# Patient Record
Sex: Female | Born: 1935 | Race: White | Hispanic: No | Smoking: Never smoker
Health system: Southern US, Academic
[De-identification: ages and names within clinical notes are randomized; demographics above are authoritative.]

## PROBLEM LIST (undated history)

## (undated) DIAGNOSIS — I639 Cerebral infarction, unspecified: Secondary | ICD-10-CM

## (undated) DIAGNOSIS — B338 Other specified viral diseases: Secondary | ICD-10-CM

## (undated) DIAGNOSIS — I10 Essential (primary) hypertension: Secondary | ICD-10-CM

## (undated) DIAGNOSIS — H3530 Unspecified macular degeneration: Secondary | ICD-10-CM

## (undated) DIAGNOSIS — K5730 Diverticulosis of large intestine without perforation or abscess without bleeding: Secondary | ICD-10-CM

## (undated) HISTORY — PX: HX HYSTERECTOMY: SHX81

## (undated) HISTORY — PX: REVISION OF TOTAL HIP FEMORAL: SUR1276

## (undated) HISTORY — PX: HX CATARACT REMOVAL: SHX102

---

## 2002-02-08 ENCOUNTER — Other Ambulatory Visit (HOSPITAL_COMMUNITY): Payer: Self-pay

## 2007-10-19 IMAGING — MG MAMMO SCREEN W CAD
1 series · 4 of 4 positions shown · non-contrast
Comparison: 06/26/2006.

Amazigh, Quirijn

BILATERAL DIGITAL SCREENING MAMMOGRAMS COMPUTED-AIDED DIAGNOSIS:
HISTORY: Routine screening.

[Series 2: R CC · right · 4 of 4 slices shown]
[im 1/4]
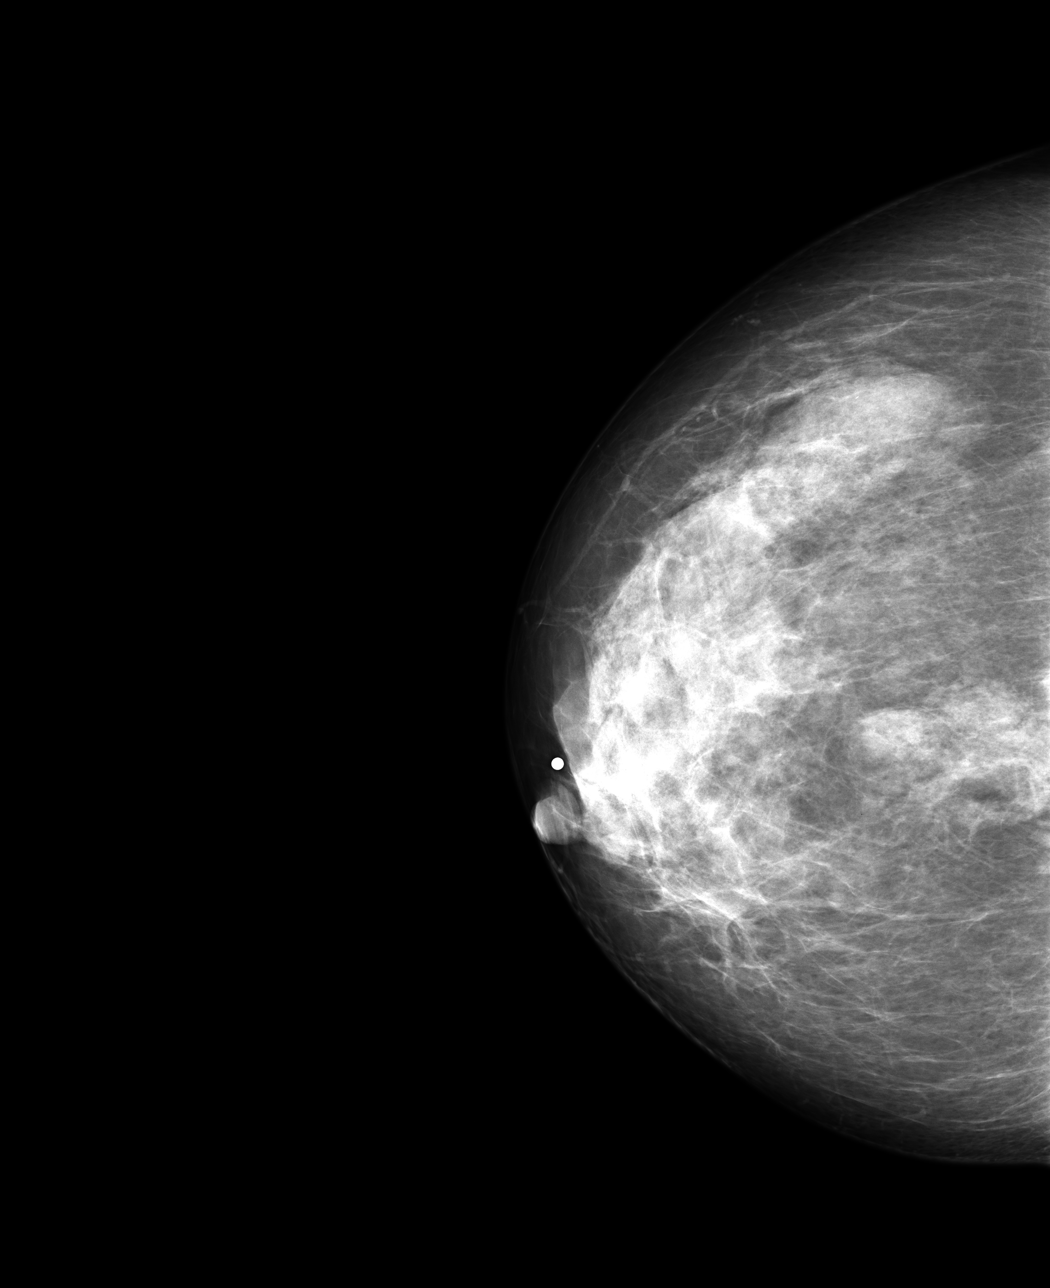
[im 2/4]
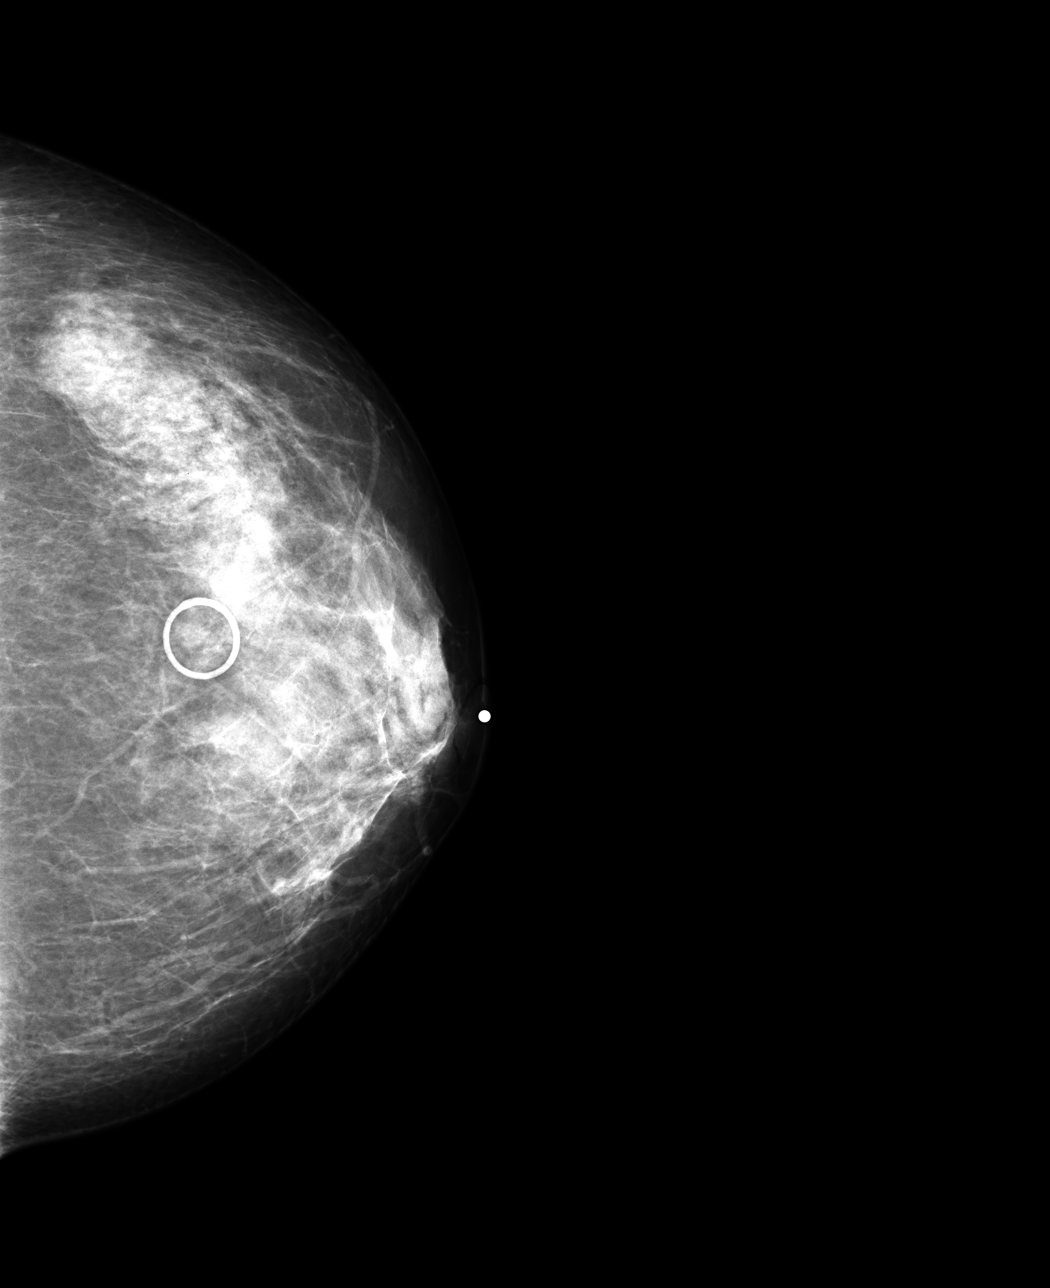
[im 3/4]
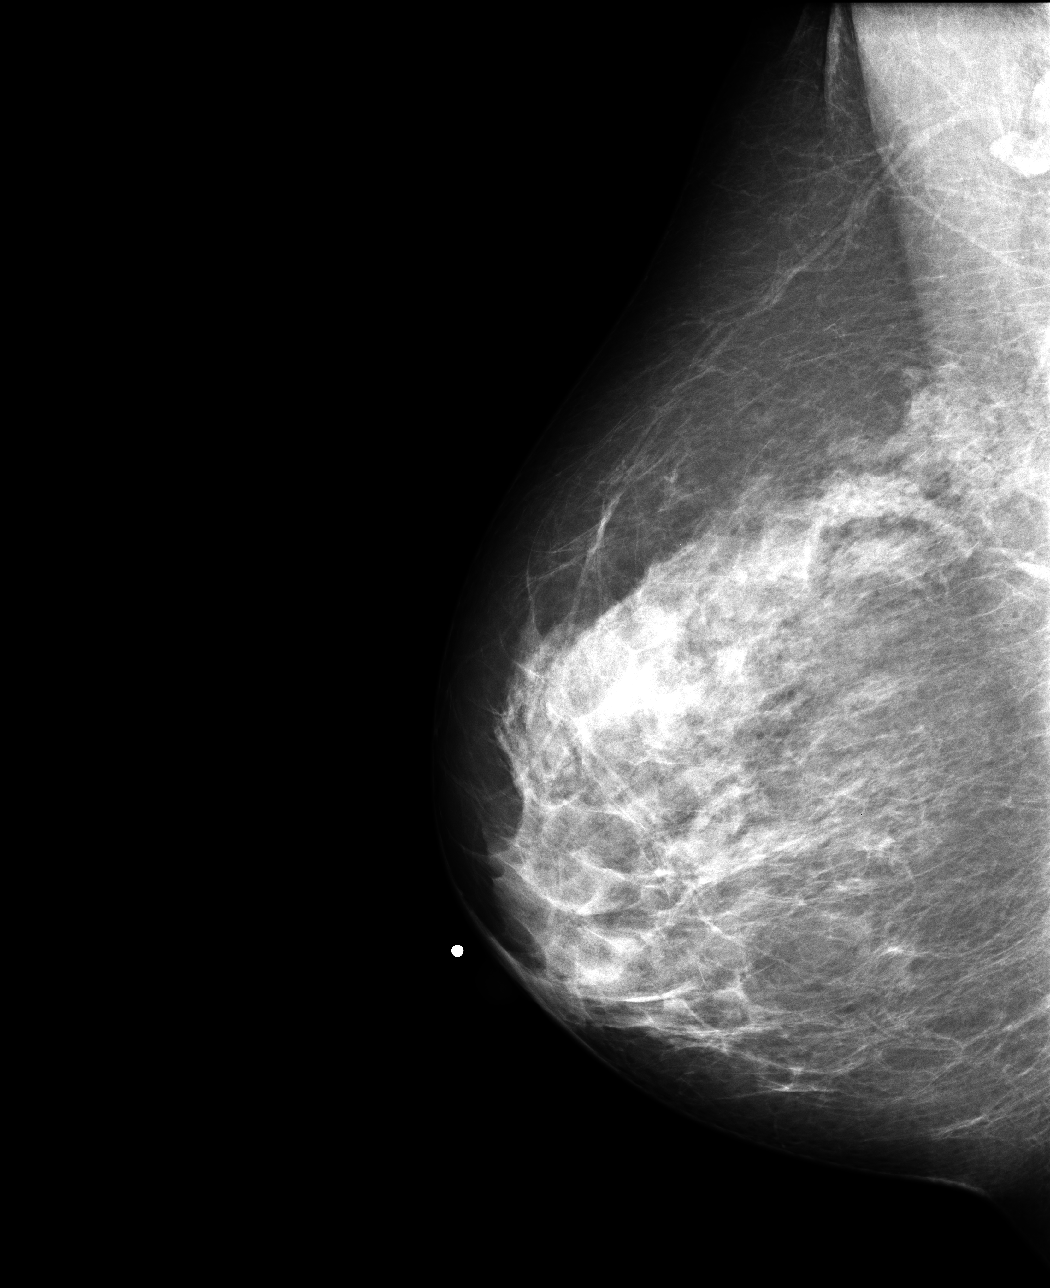
[im 4/4]
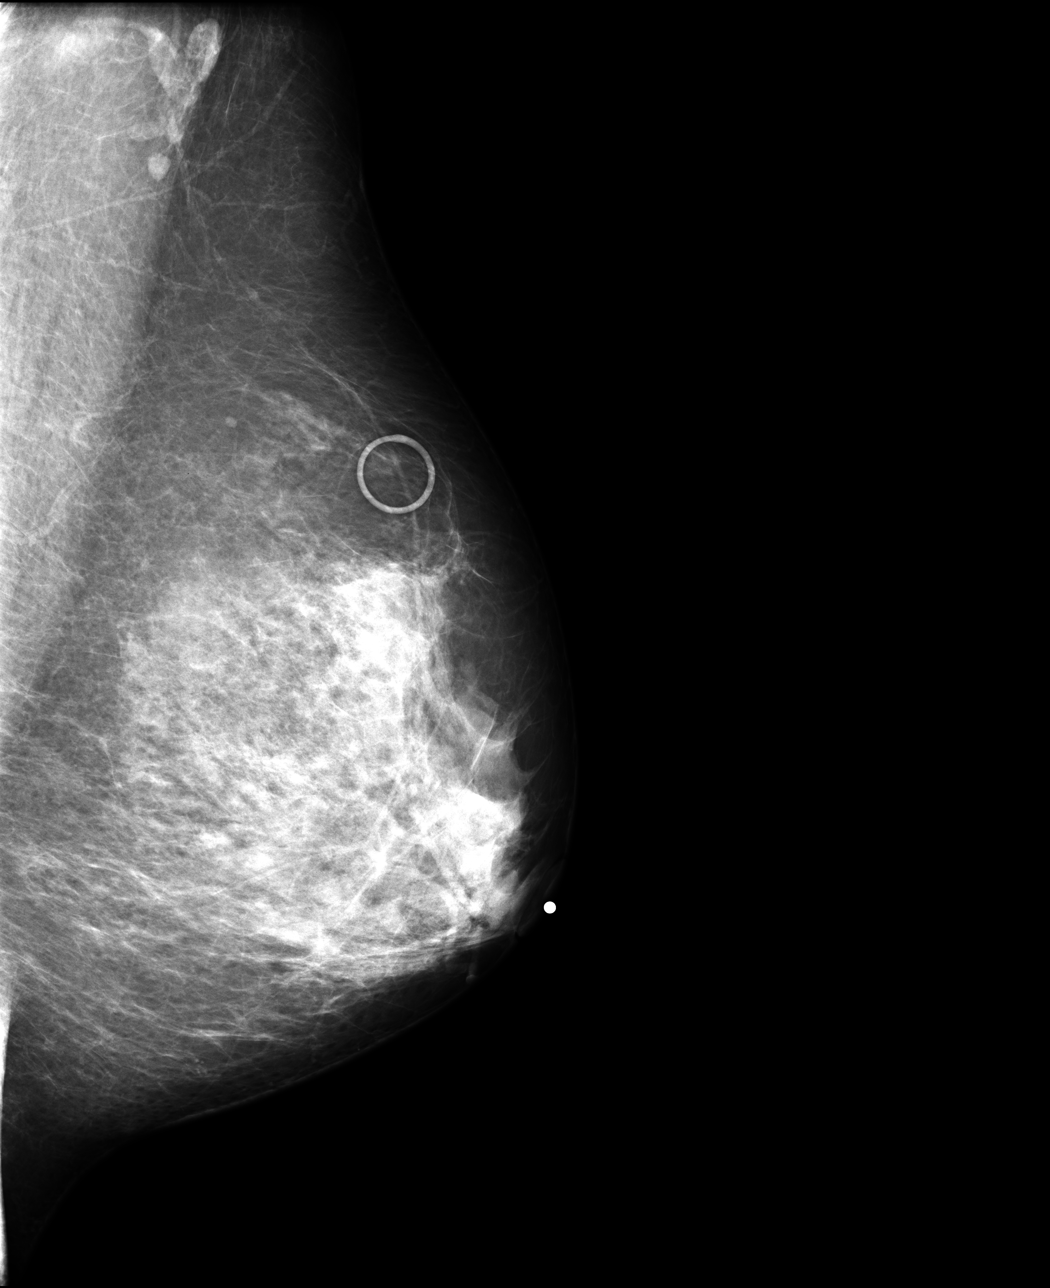

[4 of 4 positions shown; findings below may reference images not displayed]

FINDINGS: There is a questionable nodular density in the middle of the right breast seen in the craniocaudal view only.  Otherwise, no change in fibroglandular tissue.  There are no microcalcifications.

NOTE:

In compliance with Federal regulations, the results of this mammogram are being sent to the patient.
IMPRESSION: Coned compression and rolled views as well as ultrasound are recommended for further evaluation of a questionable nodular density in the middle of the right breast seen in the craniocaudal view only.  

BI-RADS 0:

BI-RADS 0
Need additional imaging evaluation
BI-RADS 1
Negative mammogram
BI-RADS 2
Benign finding
BI-RADS 3
Probably benign finding - short interval follow-up suggested
BI-RADS 4
Suspicious abnormality: biopsy should be considered
BI-RADS 5
Highly suggestive of malignancy; appropriate action should be taken

________________________________

## 2007-10-21 IMAGING — MG MAMMO SCREEN W CAD
1 series · 2 of 2 positions shown · non-contrast
Comparison: Screening mammograms of 10/19/07 and 06/26/06.

Mancera, Nutriologa
Dig Mammo CB AV, Rt Breast US

Right Breast Digital Mammography with Computer Assisted Diagnosis:
Ultrasound of the Right Breast:
HISTORY: Asymptomatic 78-year-old underwent screening mammogram that showed asymmetric density in the posterior aspect of the right breast in the CC projection.  There is no family history of breast cancer in first degree relatives.  Lifetime breast cancer risk in this patient is average at 3%.

[Series 2: R CC · right · 2 of 2 slices shown]
[im 1/2]
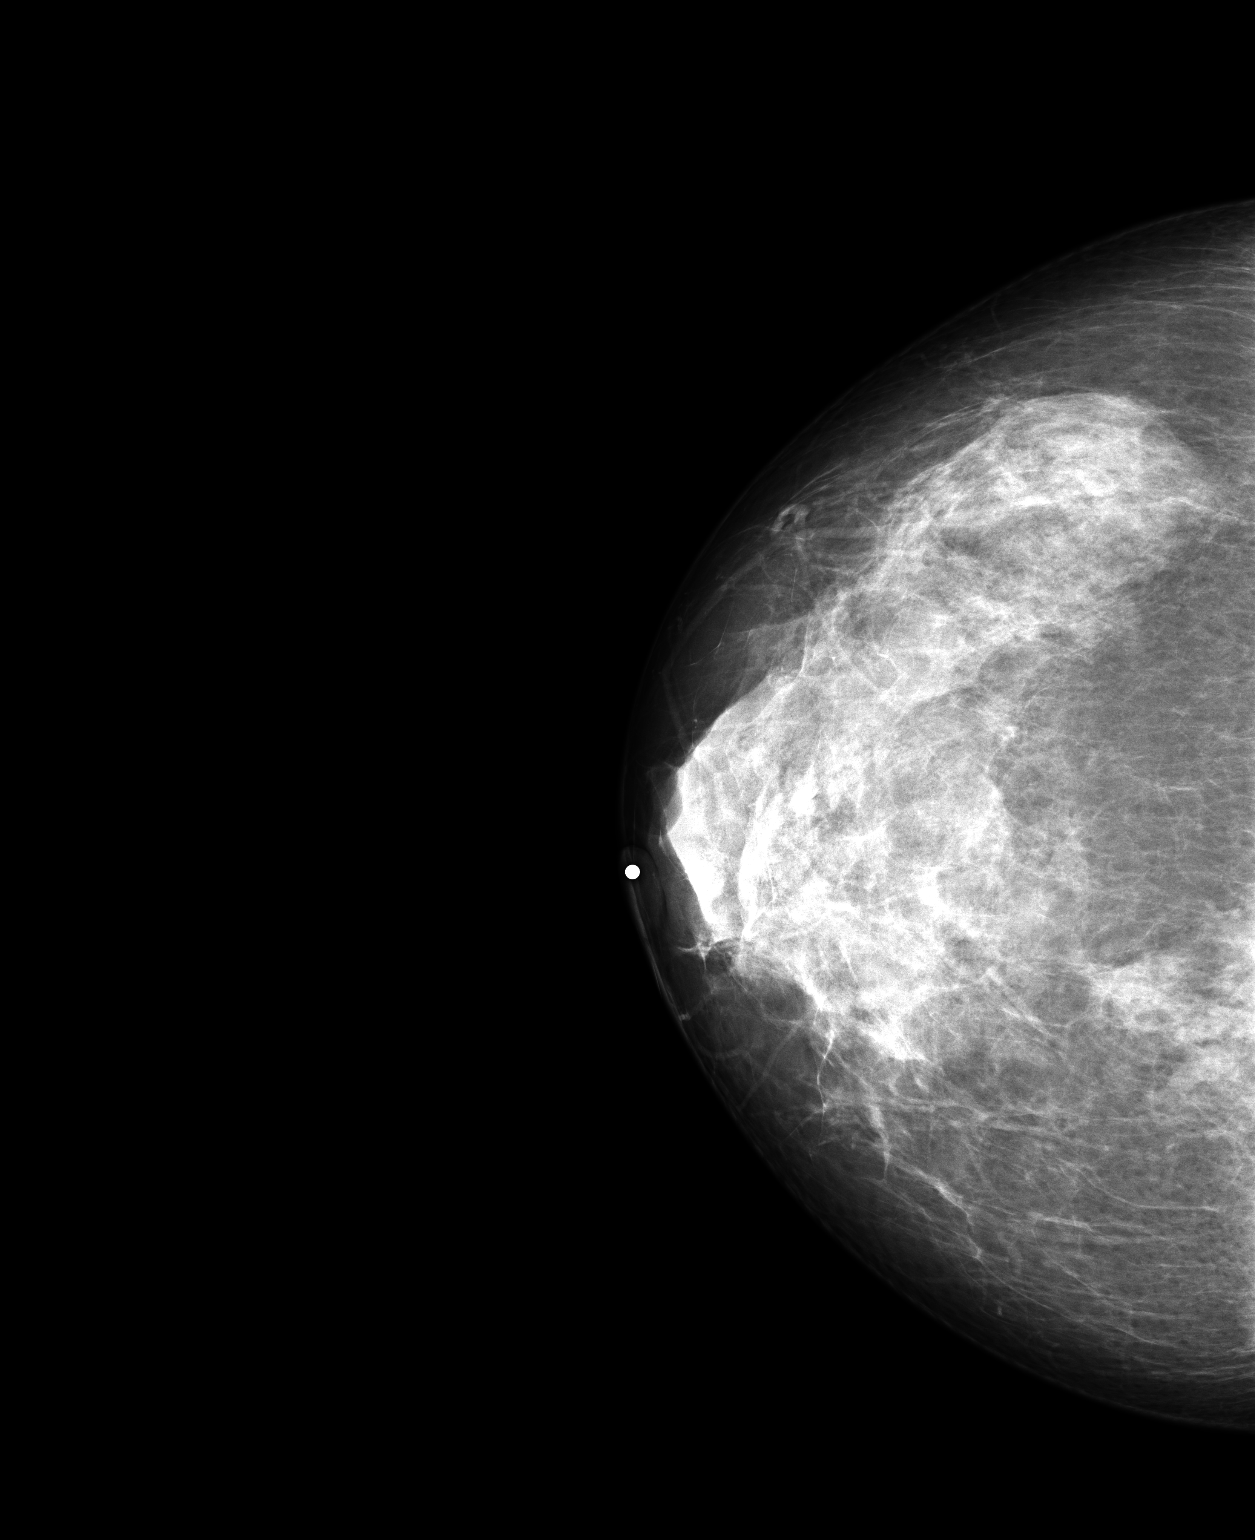
[im 2/2]
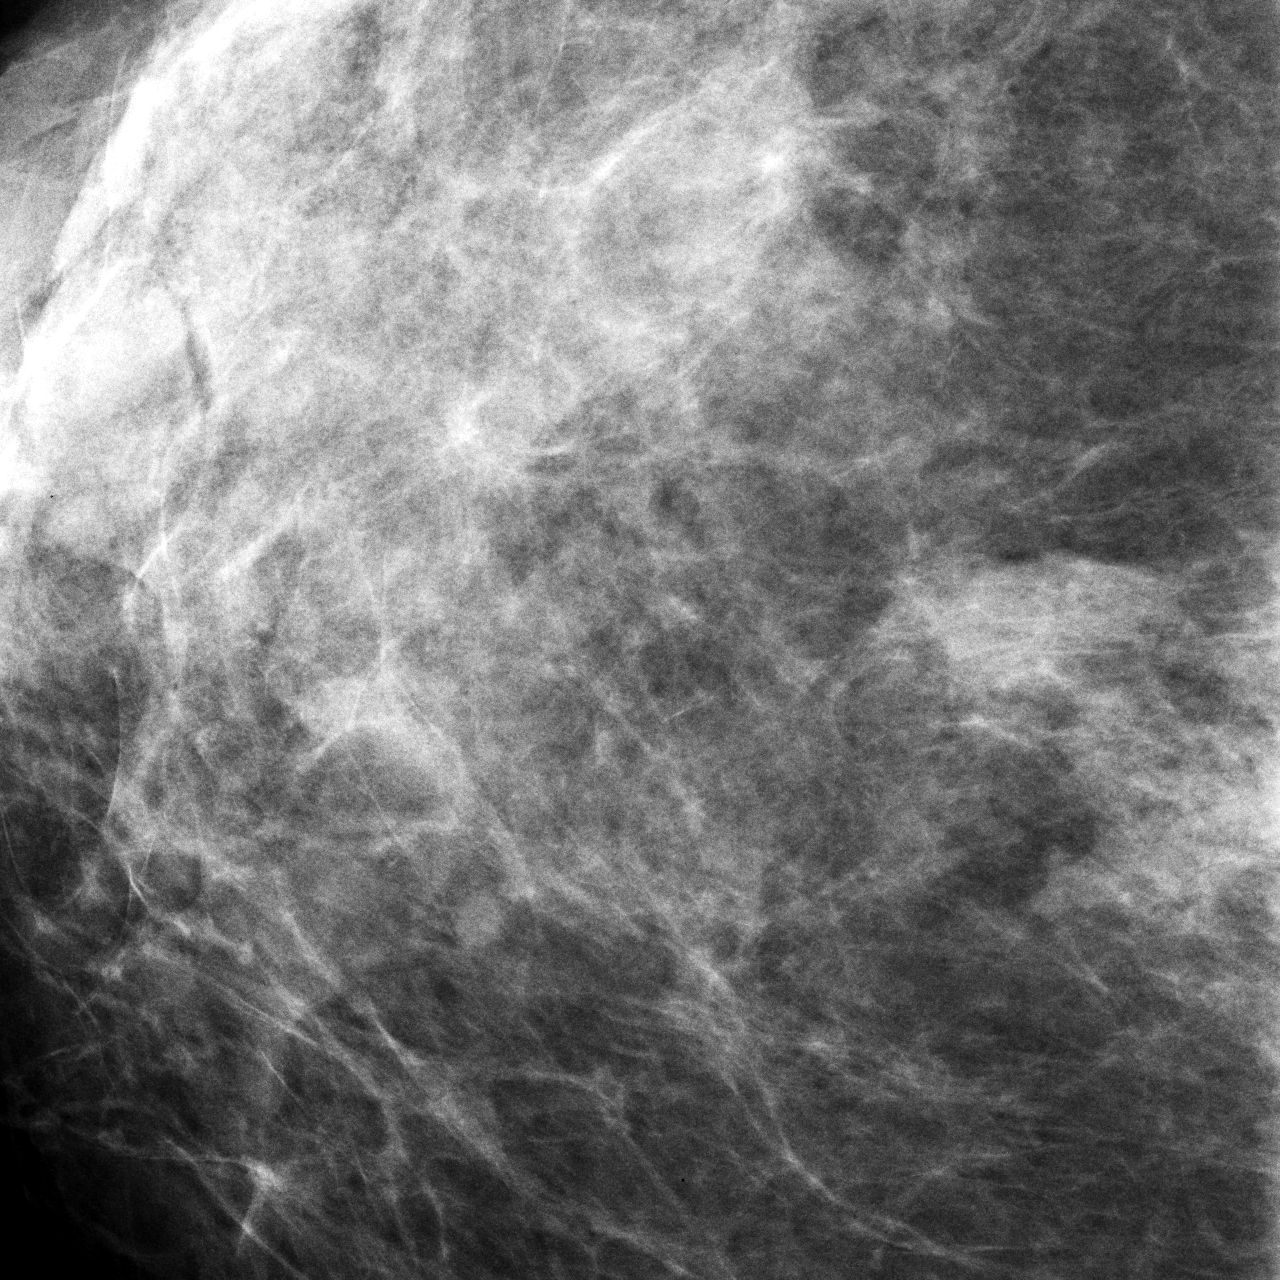

[2 of 2 positions shown; findings below may reference images not displayed]

FINDINGS: Additional views of the right breast with magnification shows no well defined masses.  Asymmetric breast tissue is noted in the posterior aspect of the right breast, just medial to the midline.  This is probably unchanged from an older mammogram of 1553.  

No calcific densities or architectural changes are seen.

High resolution ultrasound of the right breast fails to reveal cystic or solid mass or architectural change.  No lymphadenopathy is seen in the axilla.

NOTE:

In compliance with Federal regulations, the results of this mammogram are being sent to the patient.
IMPRESSION: Probably benign asymmetric breast tissue is seen in the posterior aspect of the right breast just medial to the midline.    I would recommend followup mammogram and ultrasound in 4 months to establish the stability of this lesion.  Clinical follow-up is also recommended.

Final Assessment Code:
BI- RADS: 3
BI-RADS 0
Need additional imaging evaluation
BI-RADS 1
Negative mammogram
BI-RADS 2
Benign finding
BI-RADS 3
Probably benign finding - short interval follow-up suggested
BI-RADS 4
Suspicious abnormality: biopsy should be considered
BI-RADS 5
Highly suggestive of malignancy; appropriate action should be taken

________________________________

## 2008-02-21 IMAGING — MG MAMMO UNI RT DIAGNOSTIC W CAD
1 series · 2 of 2 positions shown · non-contrast
Comparison: Mammograms of 06/26/06, 10/19/07, and 10/21/07, and ultrasound dated 10/21/07 of the right breast.

Ceejay, Paulus N

Examination:
Right breast digital diagnostic mammogram with CAD four-month followup.
HISTORY: 78-year-old who is asymptomatic regarding her breasts and was found to have asymmetric breast tissue in the superolateral right breast.  There is no family history of breast cancer in first-degree relatives.

[Series 2: R CC · right · 2 of 2 slices shown]
[im 1/2]
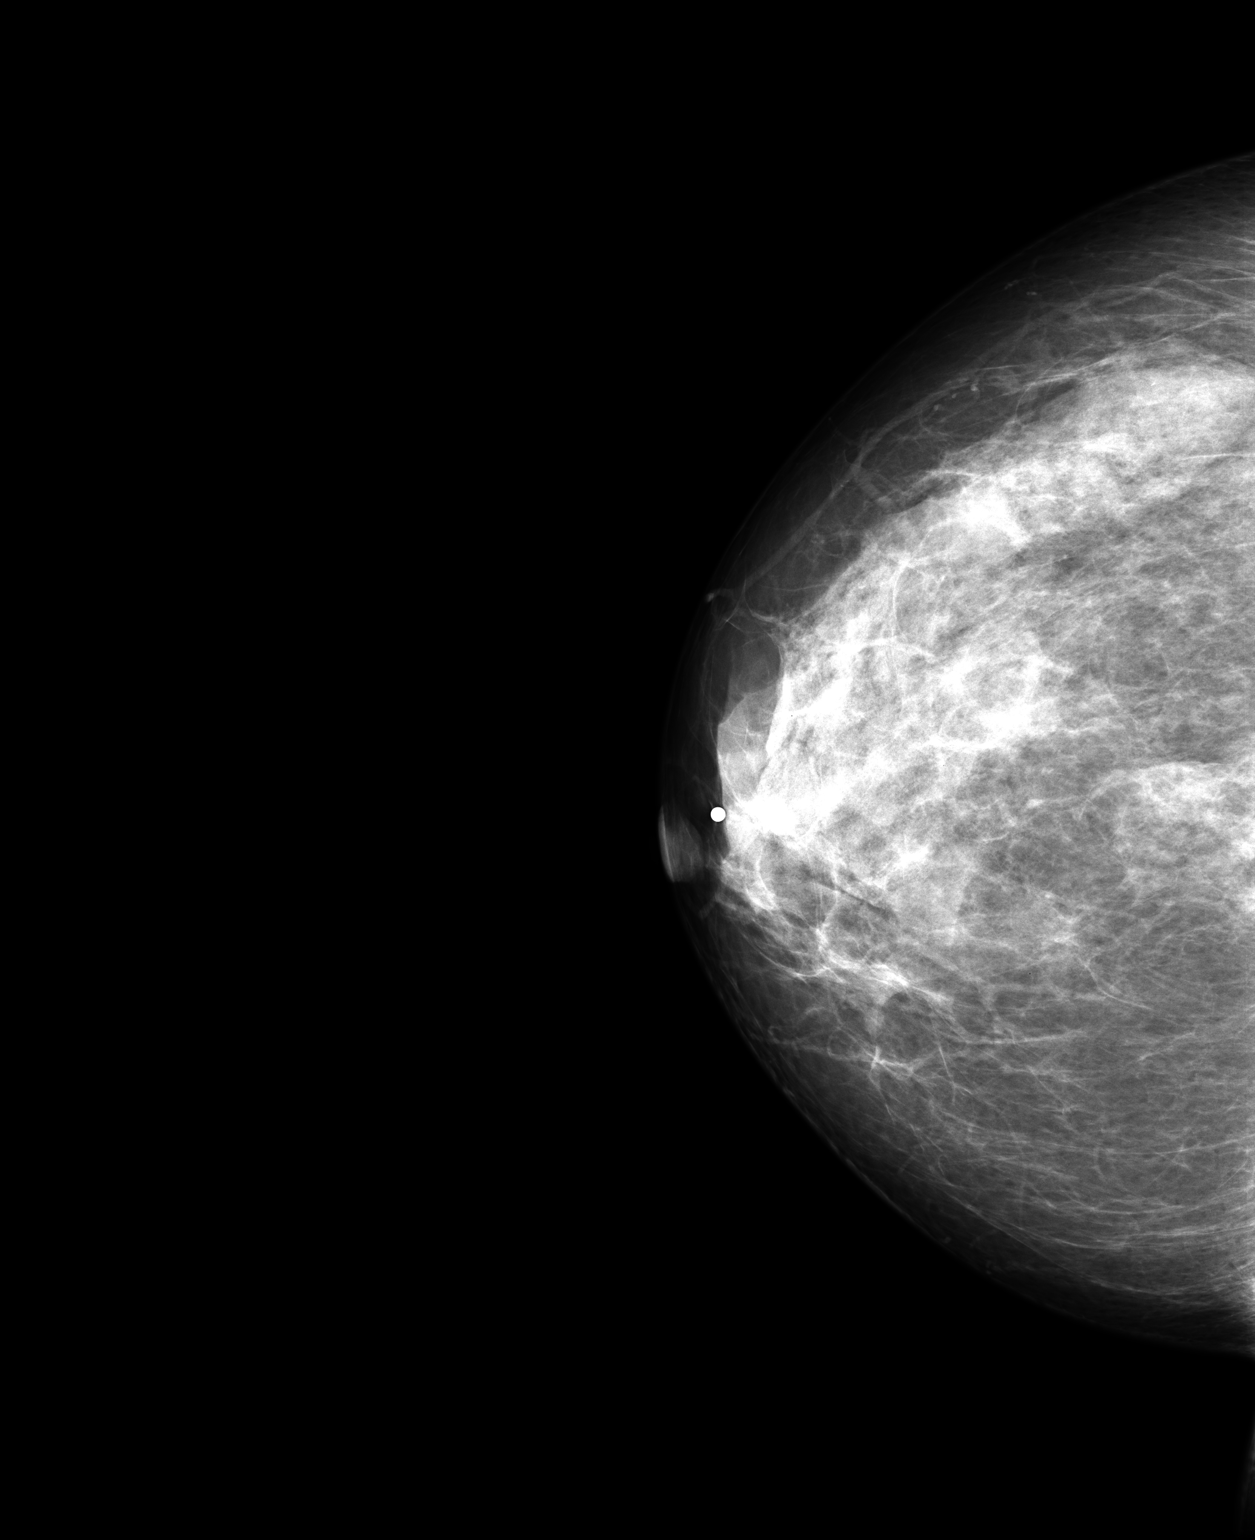
[im 2/2]
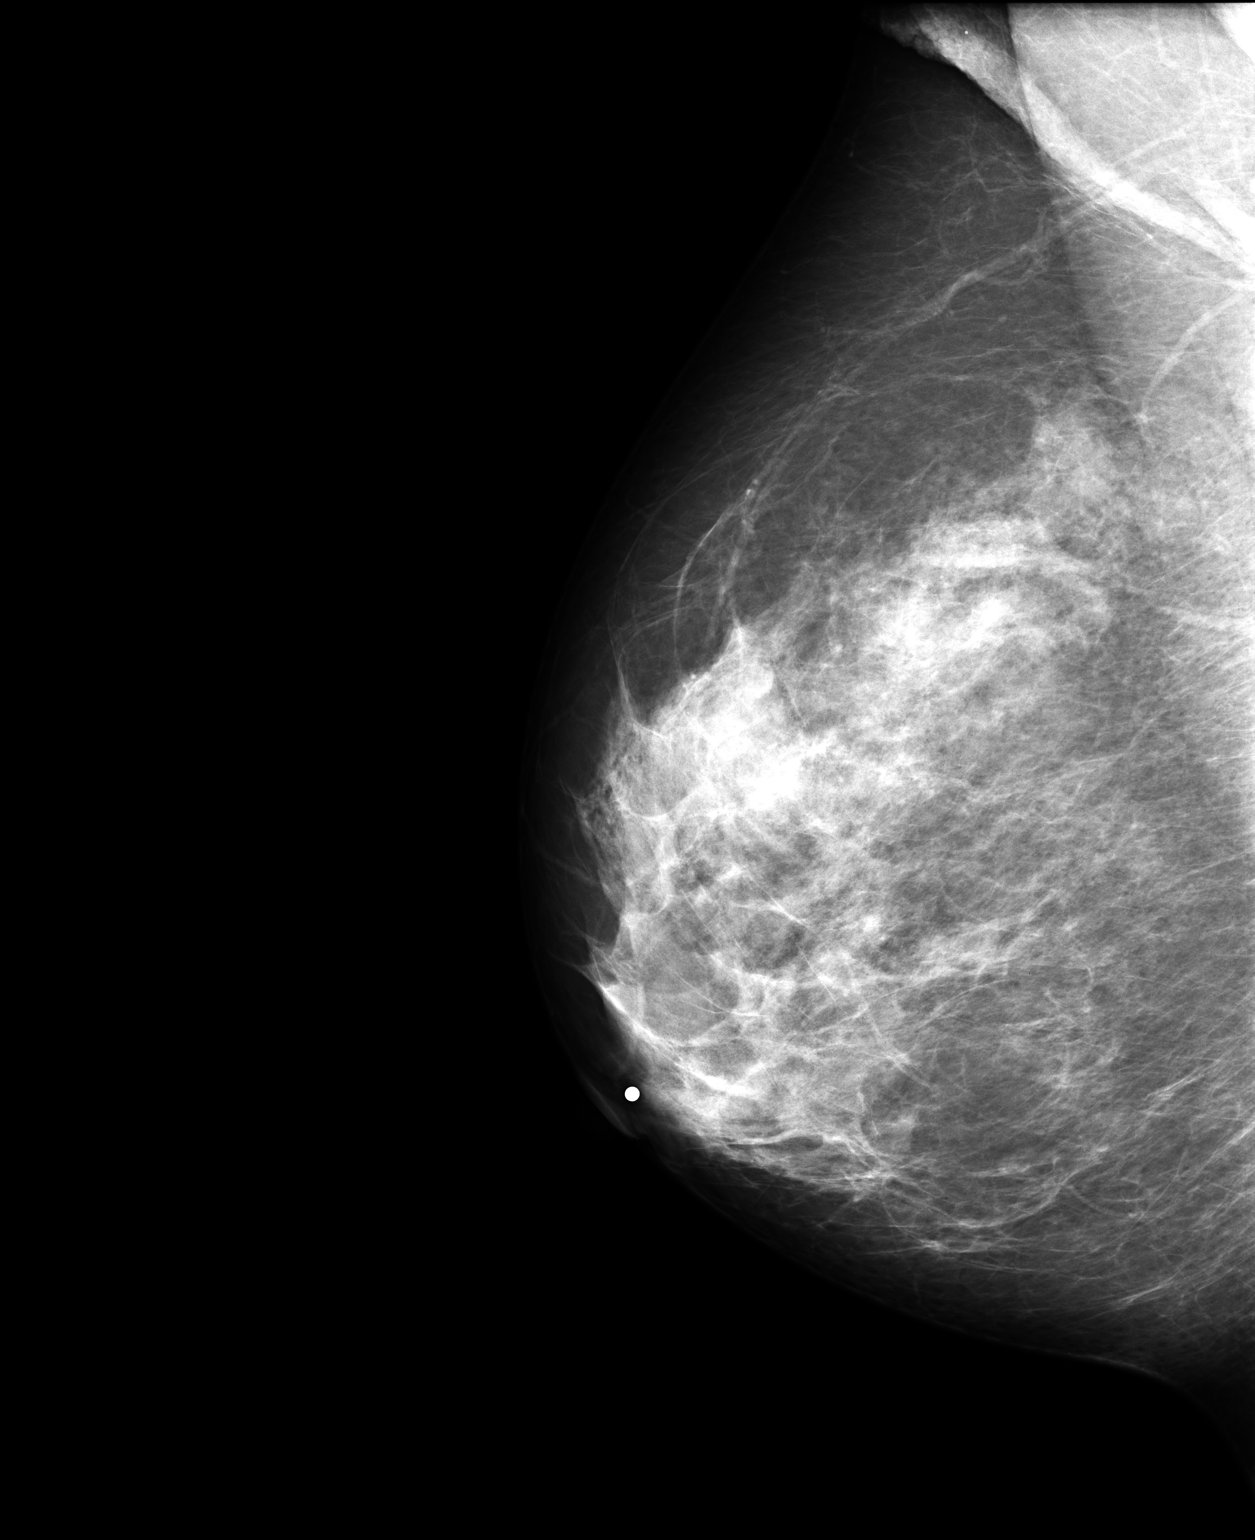

[2 of 2 positions shown; findings below may reference images not displayed]

FINDINGS: Asymmetric breast tissue in the superolateral right breast is completely stable in appearance.  No well-defined masses or architectural changes are seen.  No abnormal calcific density, skin changes or nipple changes are seen.  

NOTE:
In compliance with Federal regulations, the results of this mammogram are being sent to the patient.
IMPRESSION: Asymmetric breast tissue in the upper lateral right breast is completely stable from previous studies.  This patient will be due for a routine bilateral mammogram in about October 2008.  Clinical followup is also recommended.   BI-RADS category 2.

BI-RADS 0
Need additional imaging evaluation
BI-RADS 1
Negative mammogram
BI-RADS 2
Benign finding
BI-RADS 3
Probably benign finding - short interval follow-up suggested
BI-RADS 4
Suspicious abnormality: biopsy should be considered
BI-RADS 5
Highly suggestive of malignancy; appropriate action should be taken

________________________________

## 2010-01-28 IMAGING — MG MAMMO SCREEN W CAD
1 series · 5 of 5 positions shown · non-contrast
Comparison: Bilateral exam dated 10/21/07, right mammogram dated 02/21/08.

Suay, Ramazan Ali

Scutari, Kuksi
Exam:
HISTORY: Asymptomatic 80 year old with no family history of breast cancer in first degree relatives.

[Series 2: R CC · right · 5 of 5 slices shown]
[im 1/5]
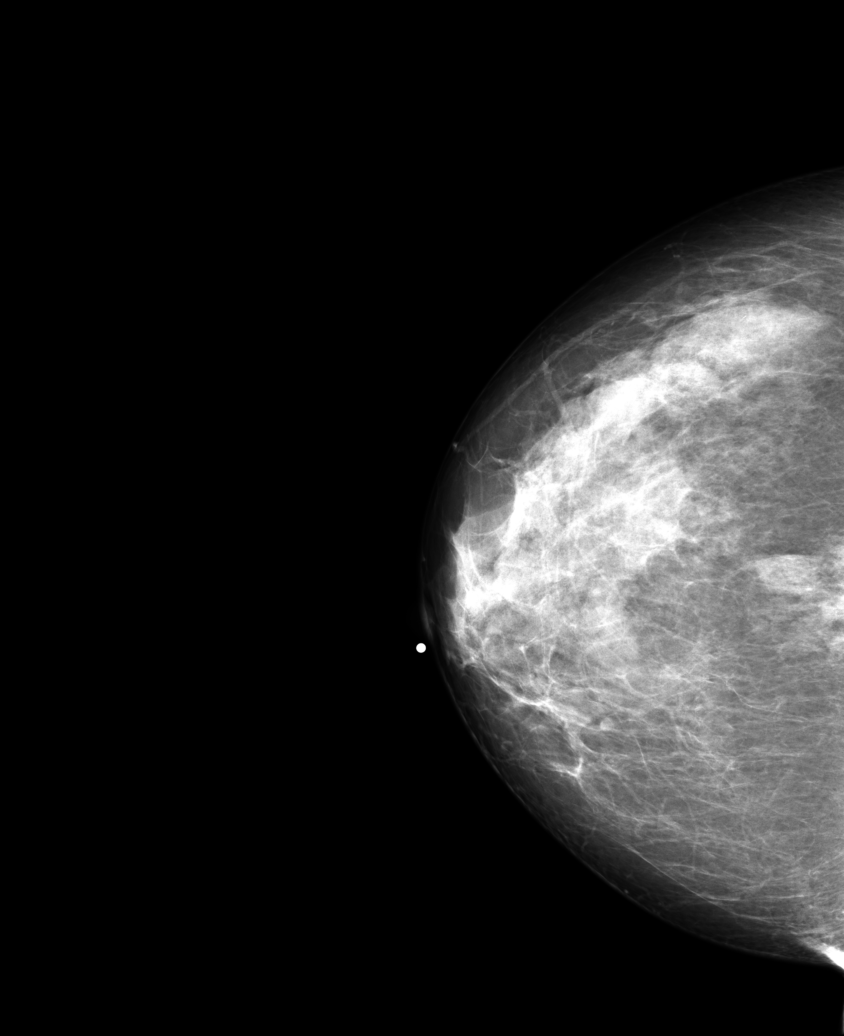
[im 2/5]
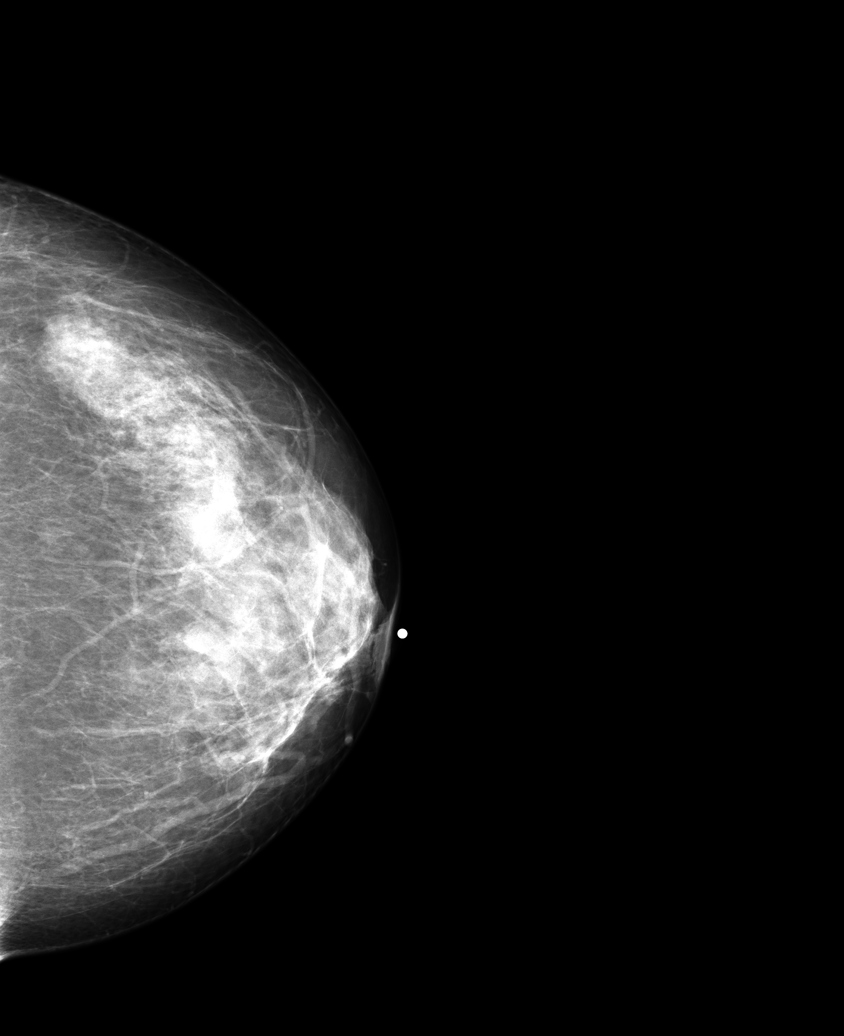
[im 3/5]
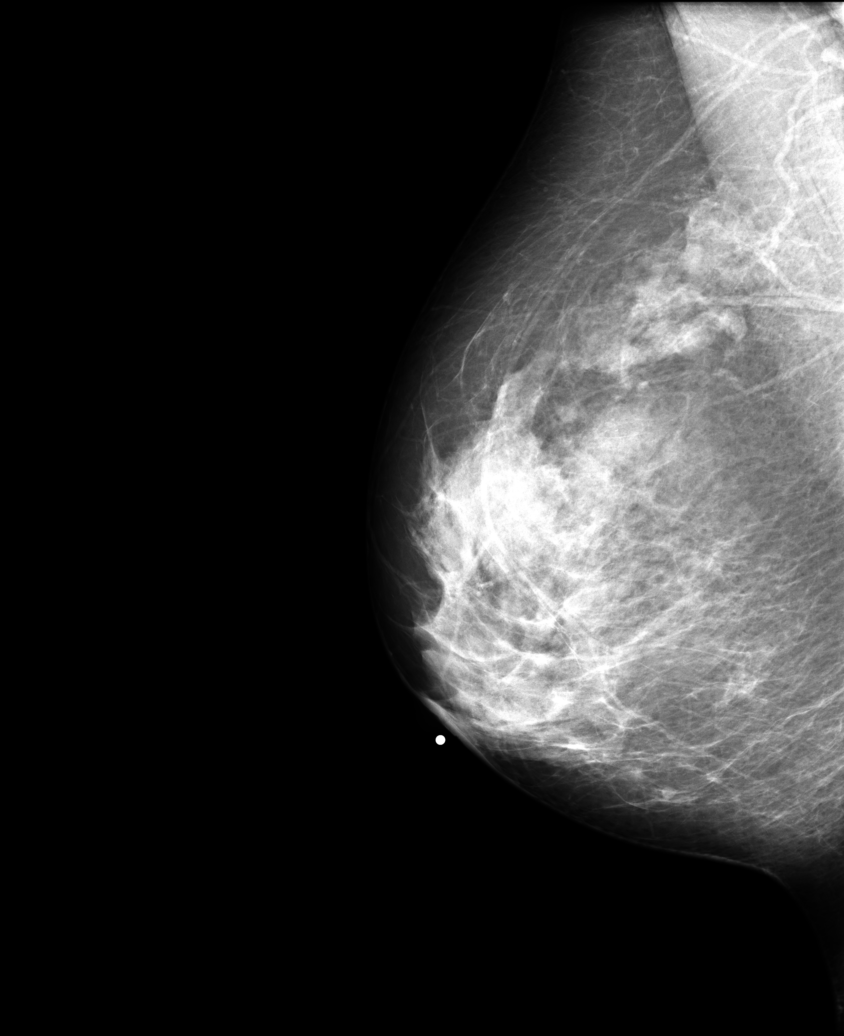
[im 4/5]
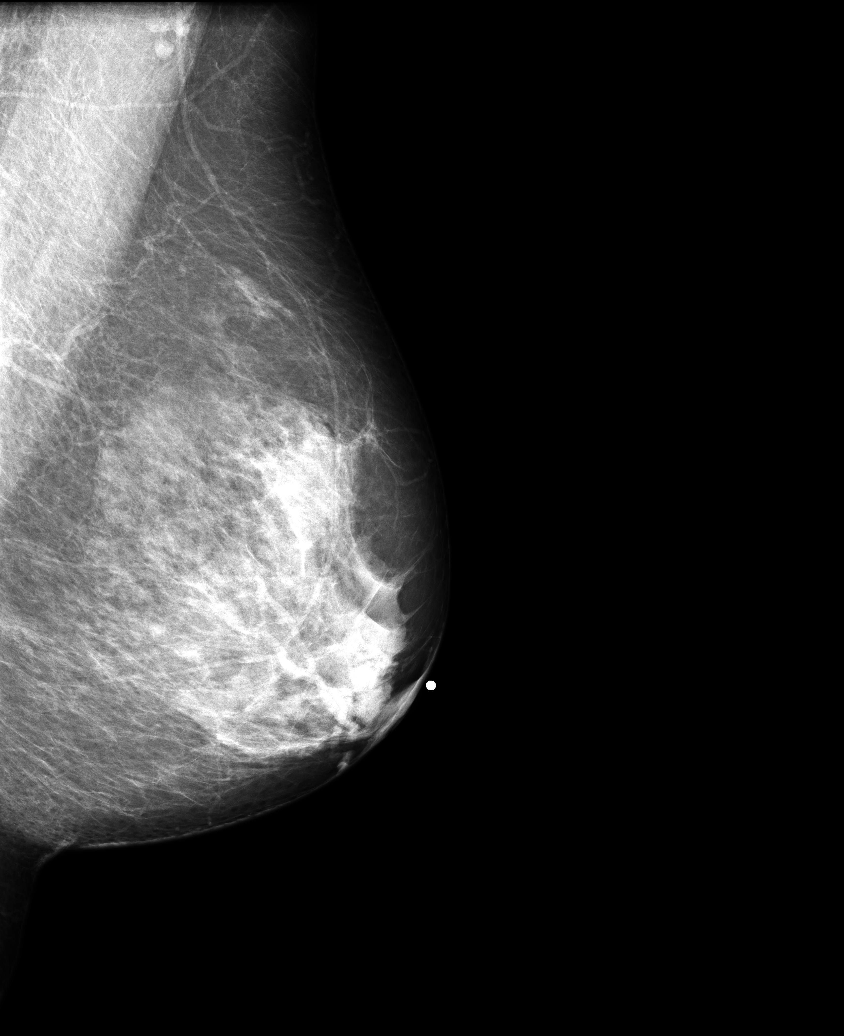
[im 5/5]
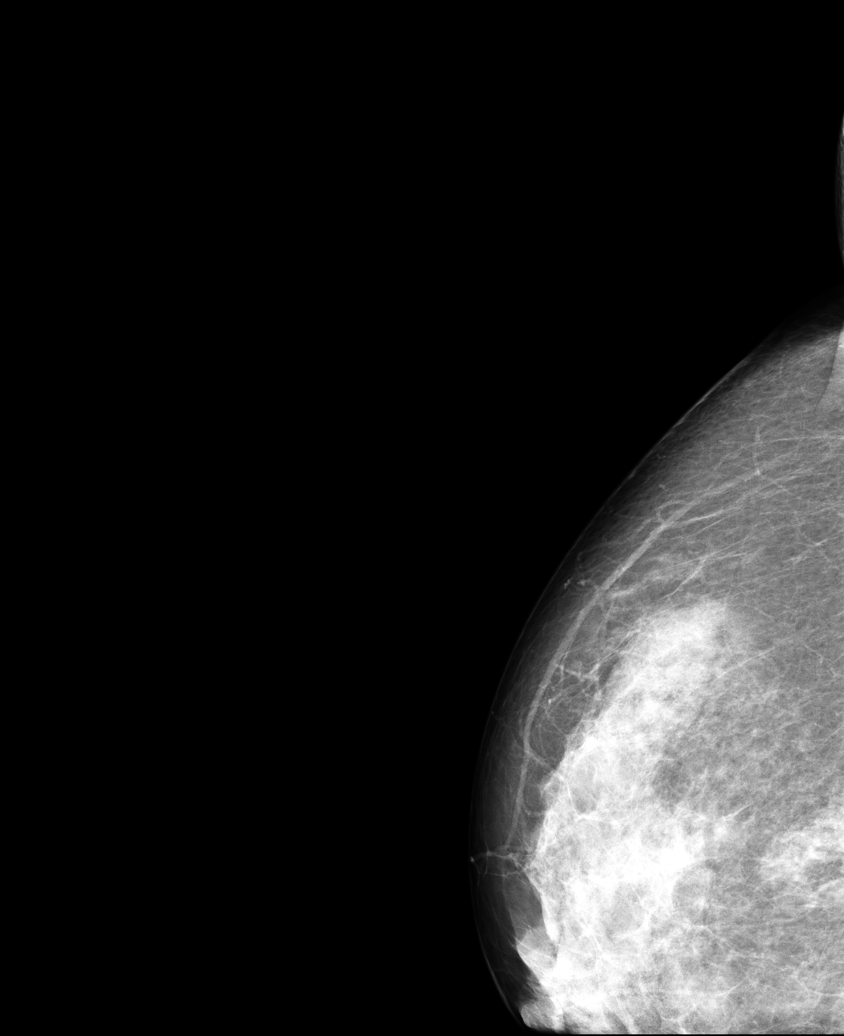

[5 of 5 positions shown; findings below may reference images not displayed]

FINDINGS: Dense breasts limits the sensitivity to mammogram. No well defined masses or architectural changes are seen. Asymmetric breast tissue in the upper lateral right breast is stable. No architectural changes, abnormal calcification, skin changes or nipple changes are seen. 

NOTE:

In compliance with Federal regulations, the results of this mammogram are being sent to the patient.
IMPRESSION: Dense breasts limits the sensitivity to the mammogram. Mammographic findings are stable including localized asymmetry in the upper lateral right breast. 
Clinical follow up and mammographic follow up are recommended at twelve months. 

Final Assessment Code:
Bi-Rads 2 

BI-RADS 0
Need additional imaging evaluation
BI-RADS 1
Negative mammogram
BI-RADS 2
Benign finding
BI-RADS 3
Probably benign finding: short-interval follow-up suggested
BI-RADS 4
Suspicious abnormality:  biopsy should be considered
BI-RADS 5
Highly suggestive of malignancy; appropriate action should be taken

________________________________
Vassily Haye., signed this document electronically

## 2011-05-23 IMAGING — MG MAMMO SCREEN W CAD
1 series · 6 of 6 positions shown · non-contrast
Comparison: 

Solano Ortega, [place]

Exam:
Bilateral digital screening mammogram with CAD
INDICATION: Annual.

[Series 2: R CC · right · 6 of 6 slices shown]
[im 1/6]
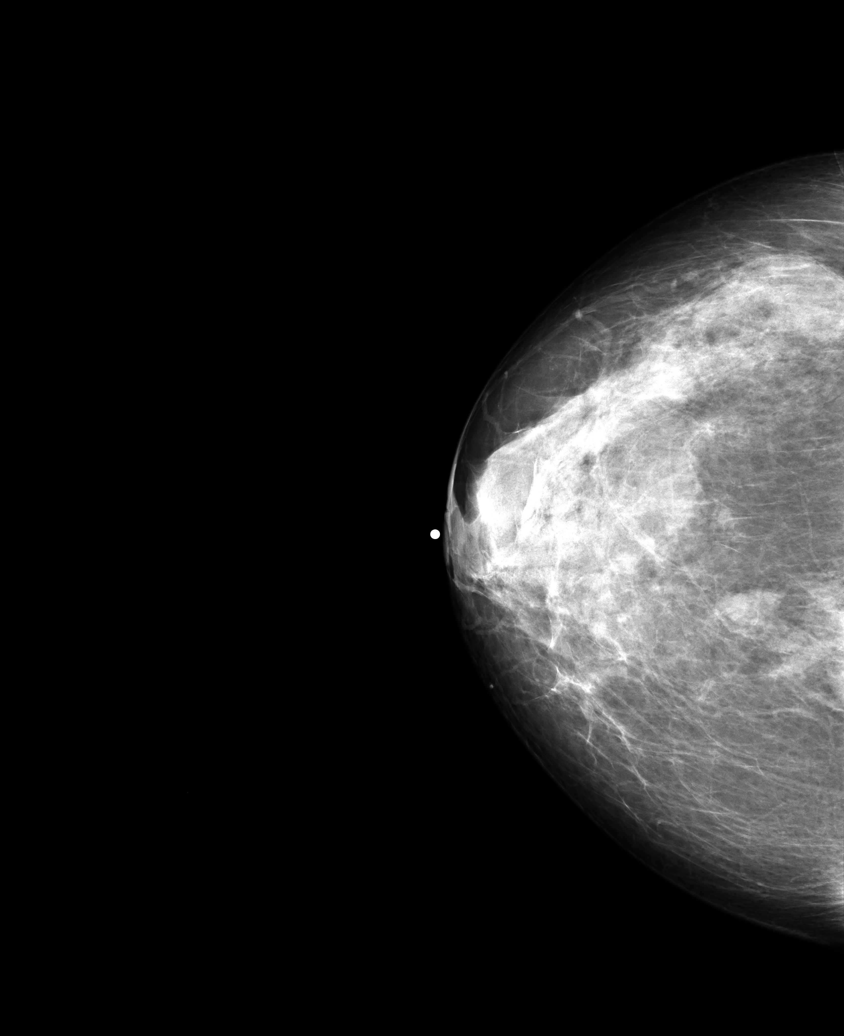
[im 2/6]
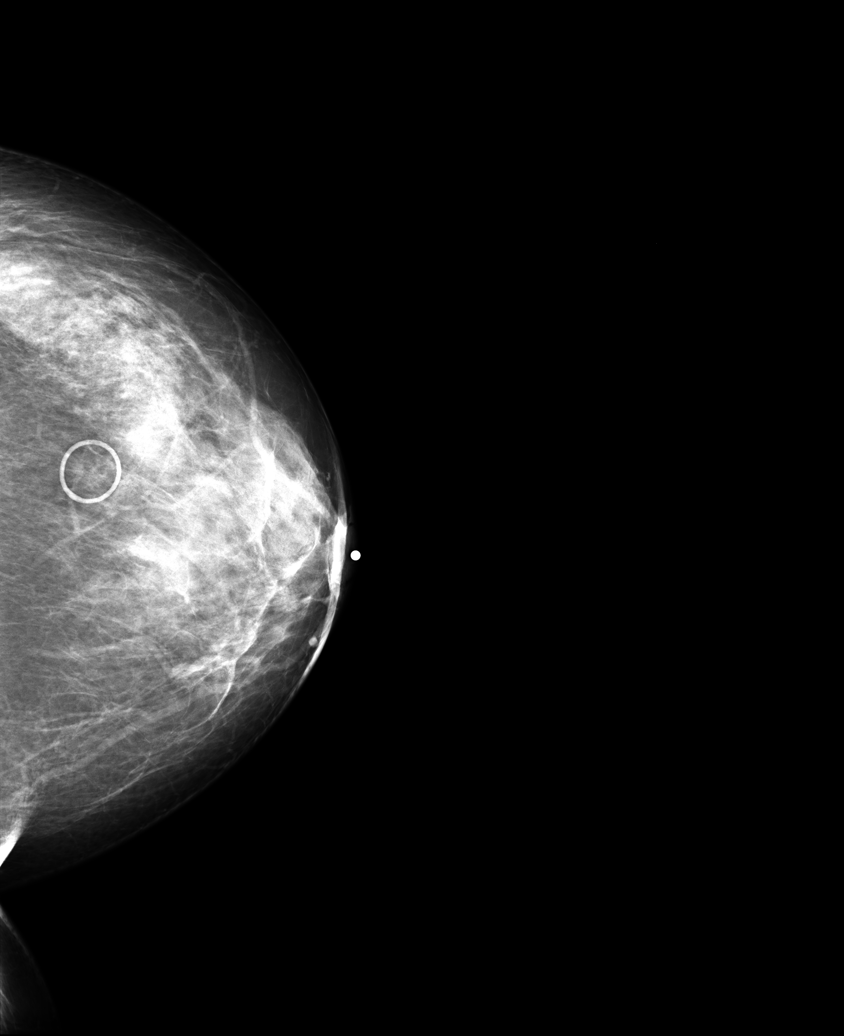
[im 3/6]
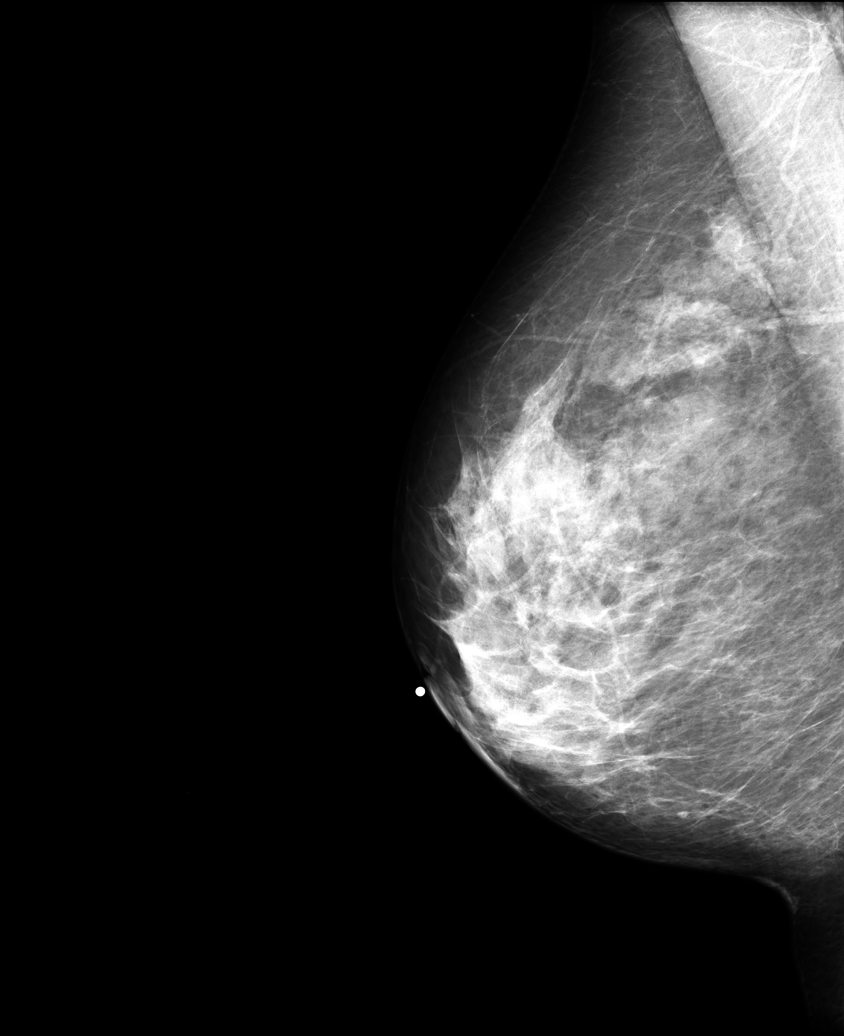
[im 4/6]
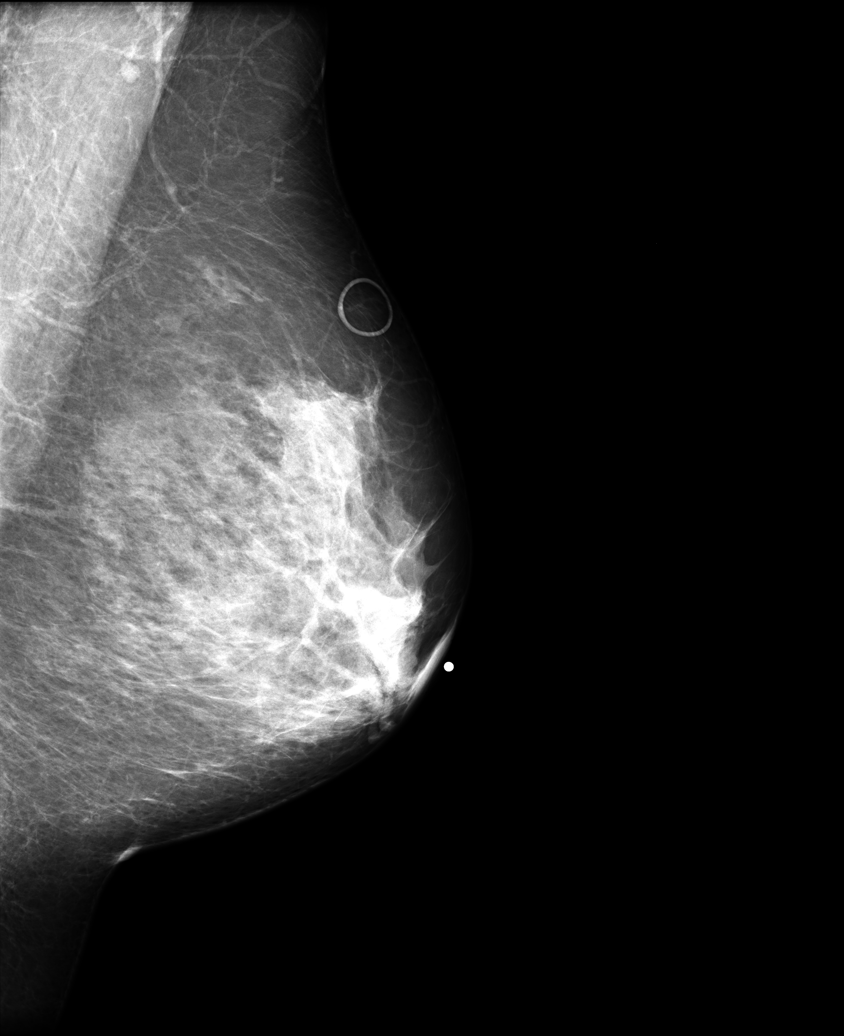
[im 5/6]
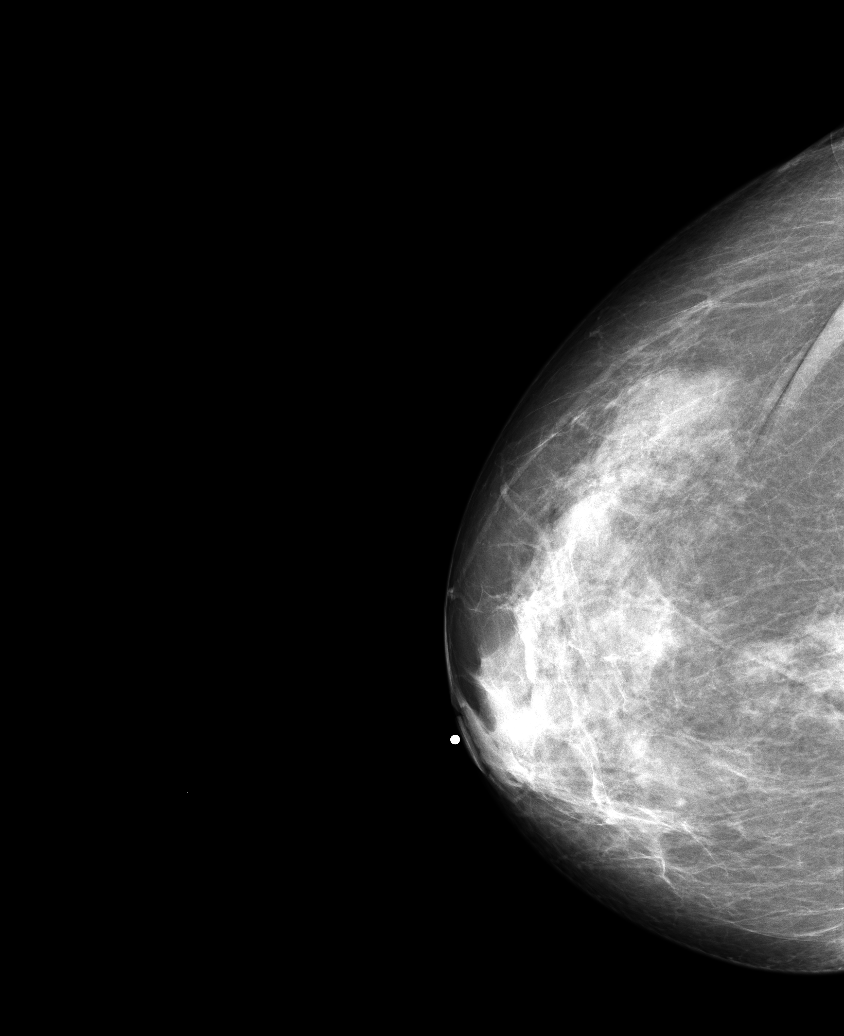
[im 6/6]
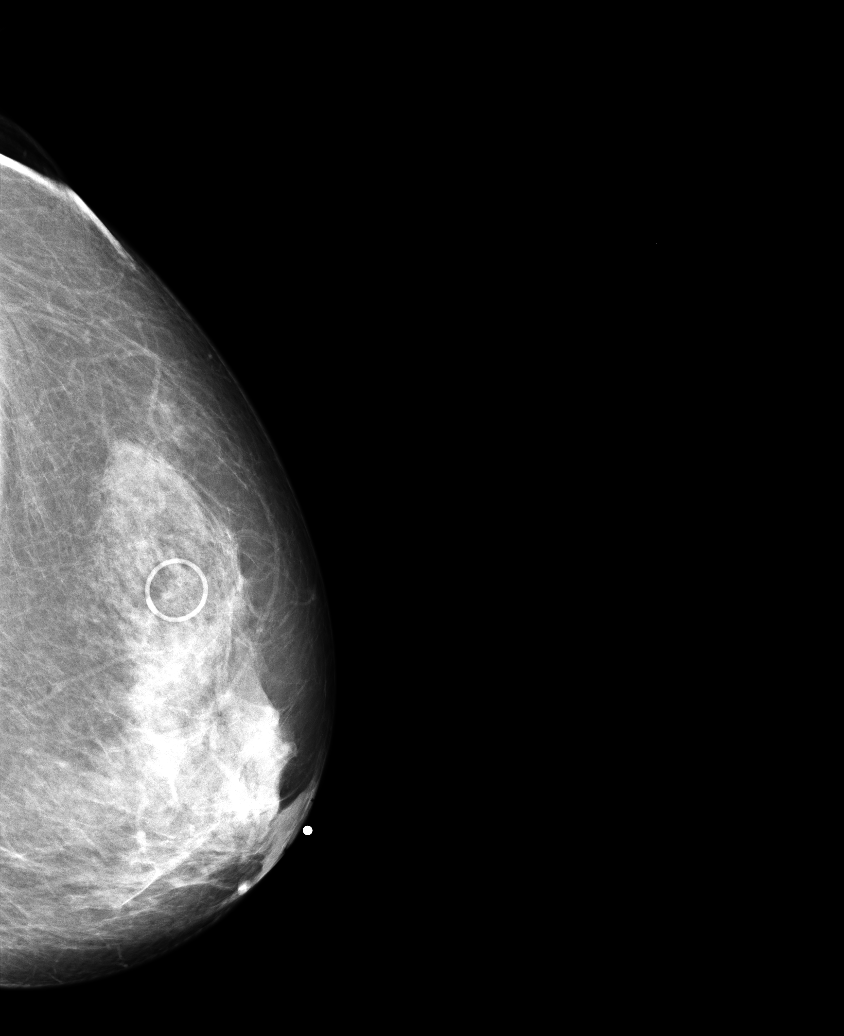

[6 of 6 positions shown; findings below may reference images not displayed]

FINDINGS: Breast parenchyma is heterogeneously dense, this decreases the sensitivity of mammogram. There is no mass or suspicious cluster of microcalcifications. There is no architectural distortion, skin thickening or nipple retraction. 

NOTE:

In compliance with Federal regulations, the results of this mammogram are being sent to the patient.
IMPRESSION: Bi-Rads 2-Benign findings. 
RECOMMENDATIONS: Annual screening mammogram as per ACR guidelines is recommended. 
Final Assessment Code:
Bi-Rads 2 

BI-RADS 0
Need additional imaging evaluation
BI-RADS 1
Negative mammogram
BI-RADS 2
Benign finding
BI-RADS 3
Probably benign finding: short-interval follow-up suggested
BI-RADS 4
Suspicious abnormality:  biopsy should be considered
BI-RADS 5
Highly suggestive of malignancy; appropriate action should be taken

________________________________

## 2013-11-02 IMAGING — CR XRAY KNEE COMPLETE RT
1 series · 5 of 5 positions shown · non-contrast
Comparison: None.

Exam:   
Bilateral knees 3V
INDICATION: Pain.

[Series 1: view not recorded · oblique · 0.17mm/px · 5 of 5 slices shown]
[im 1/5]
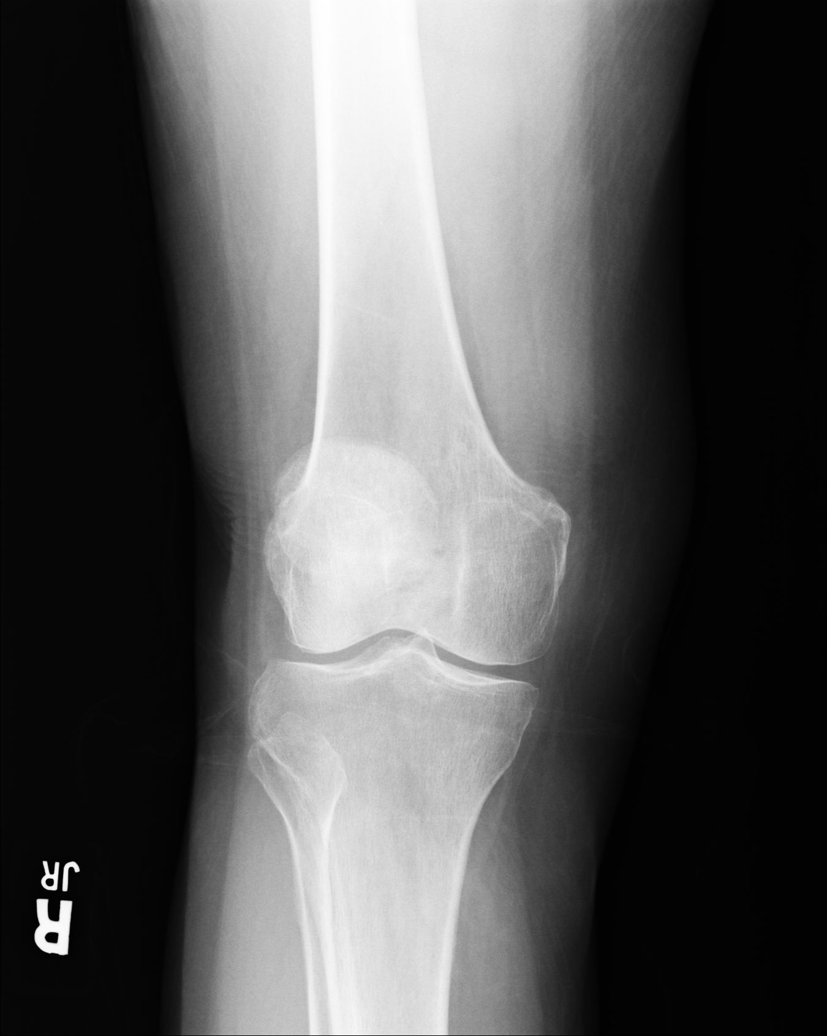
[im 2/5]
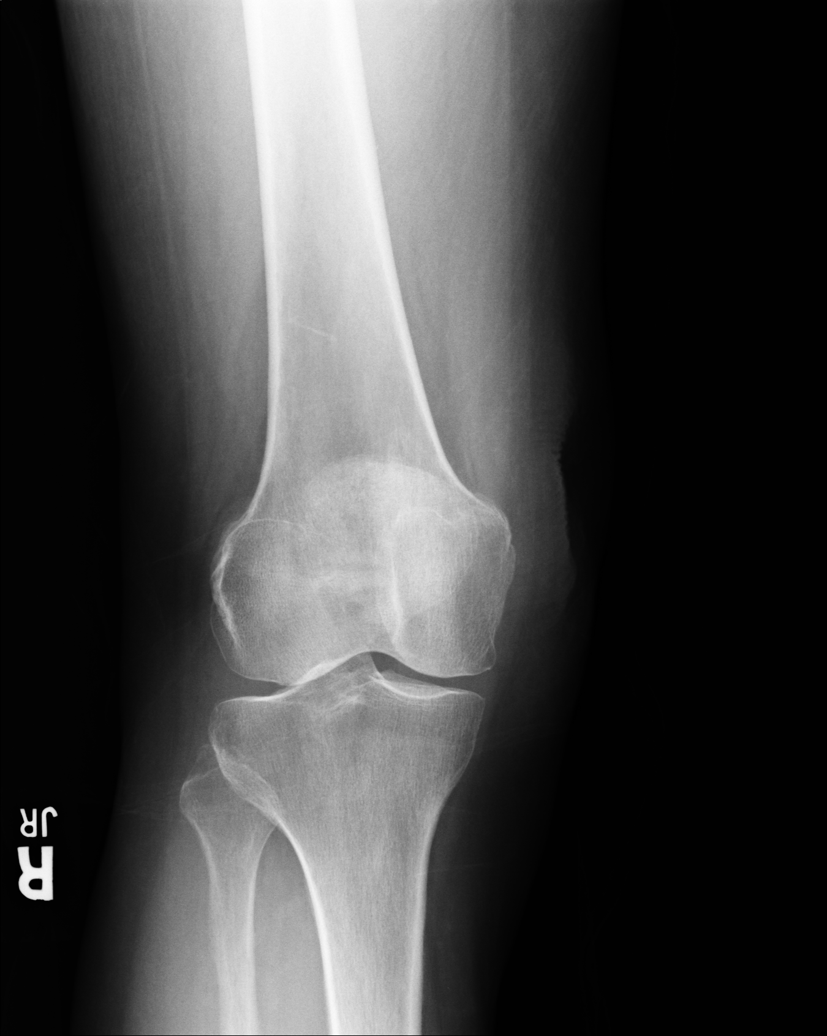
[im 3/5]
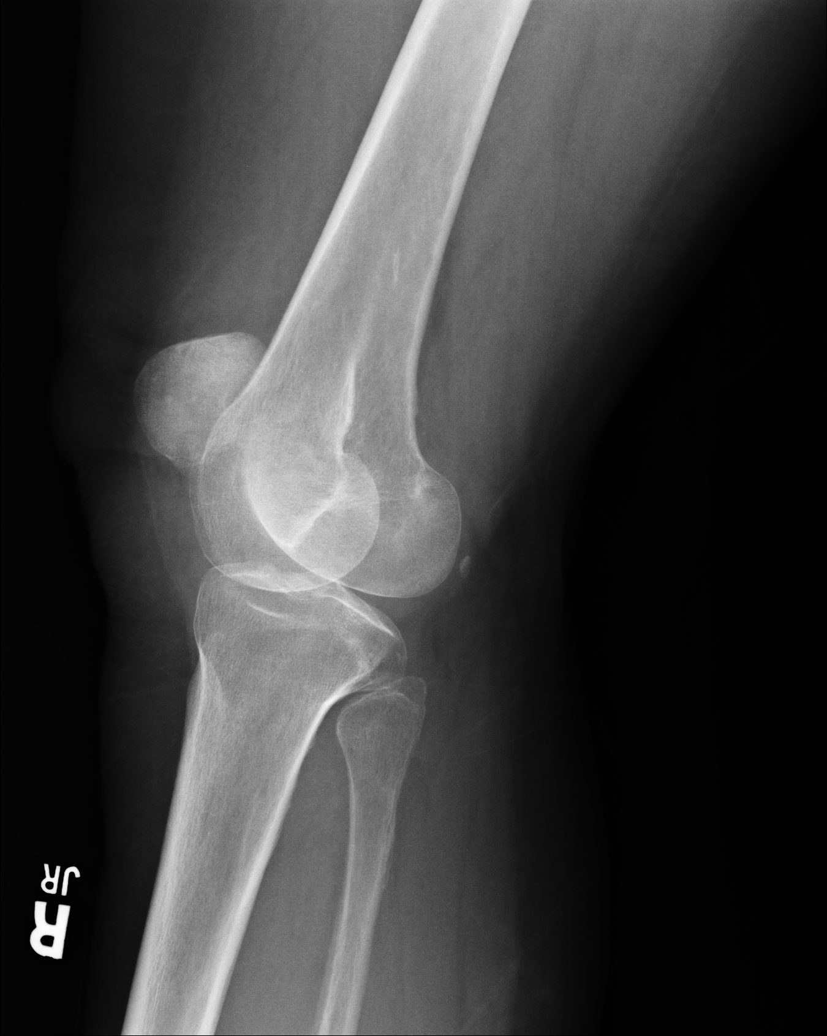
[im 4/5]
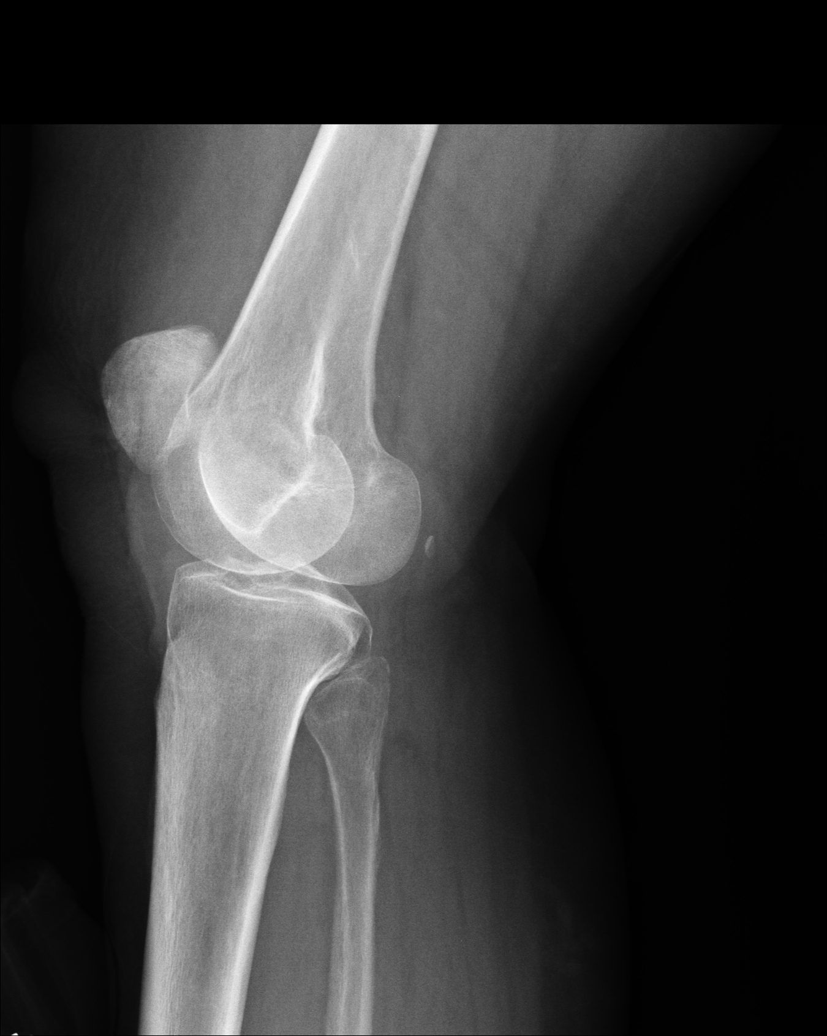
[im 5/5]
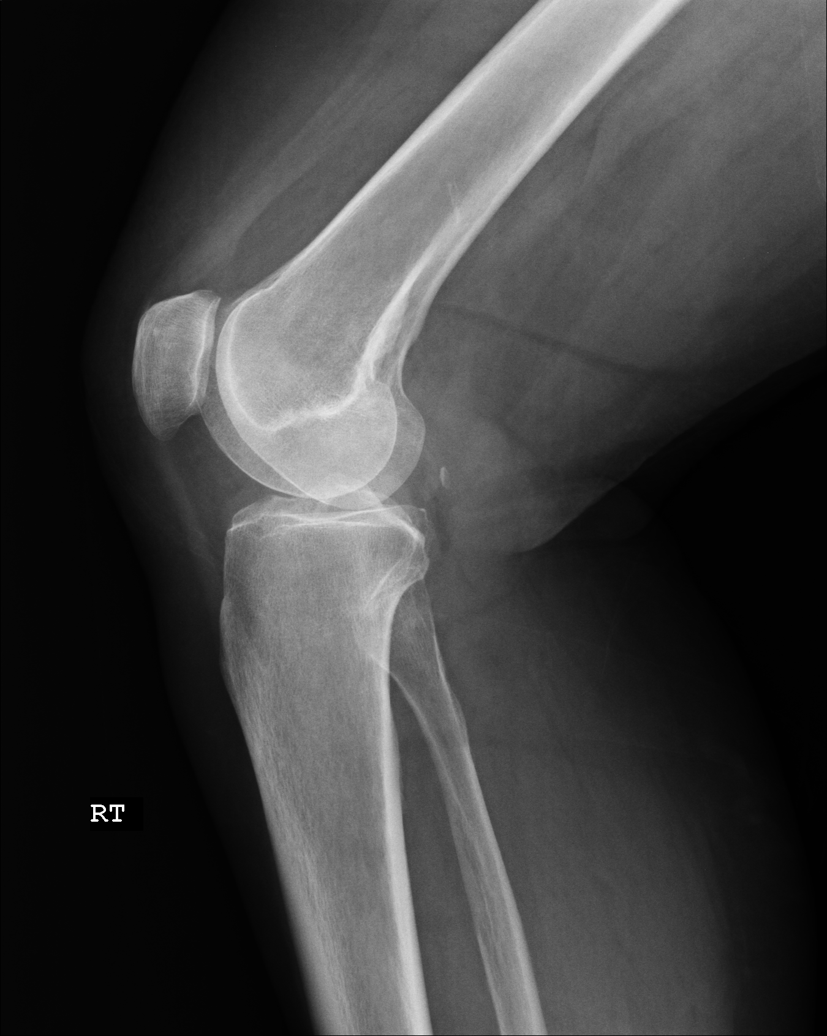

[5 of 5 positions shown; findings below may reference images not displayed]

FINDINGS: The knees are seen in multiple views. No fractures are seen. No joint effusions are appreciated. The joint spaces are preserved.
IMPRESSION: 1.
Negative bilateral knee.

## 2013-11-02 IMAGING — CR XRAY HIP RT COMPLETE
1 series · 3 of 3 positions shown · non-contrast
Comparison: None.

Exam:   
Bilateral hips 2V
INDICATION: Pain.

[Series 1: view not recorded · oblique · 0.17mm/px · 3 of 3 slices shown]
[im 1/3]
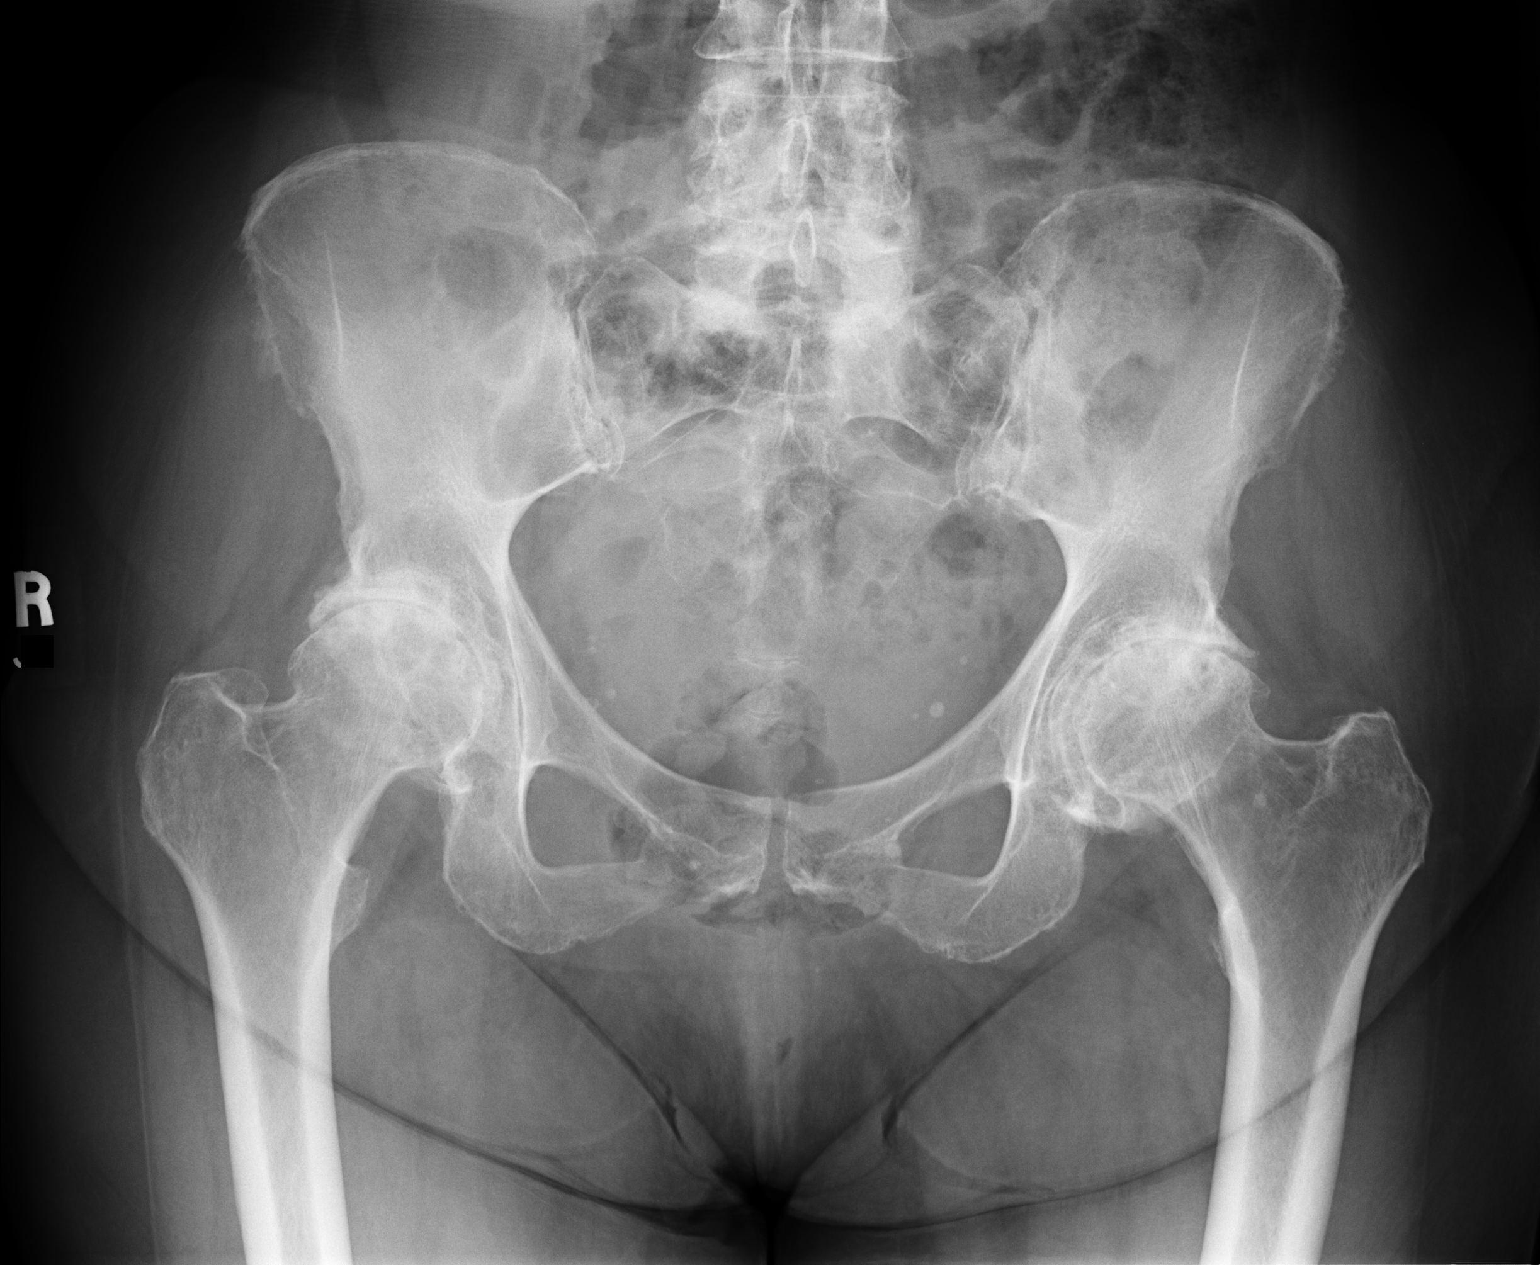
[im 2/3]
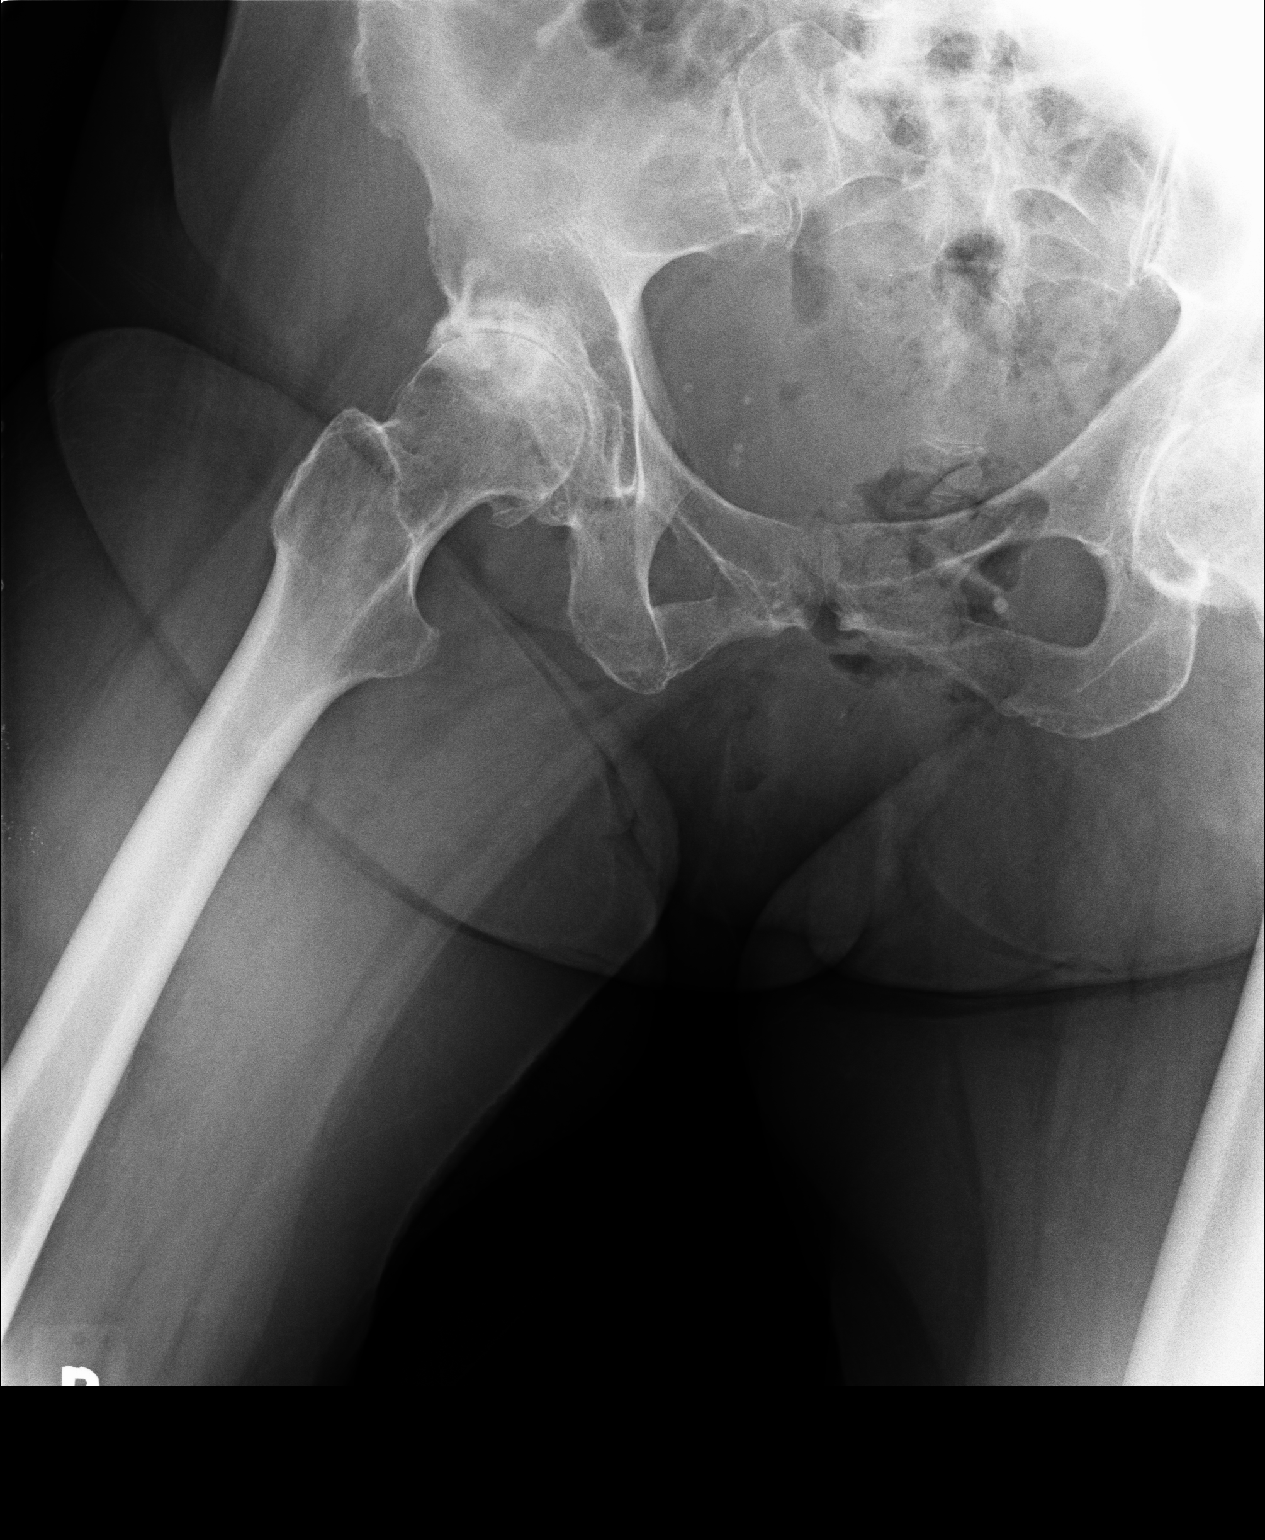
[im 3/3]
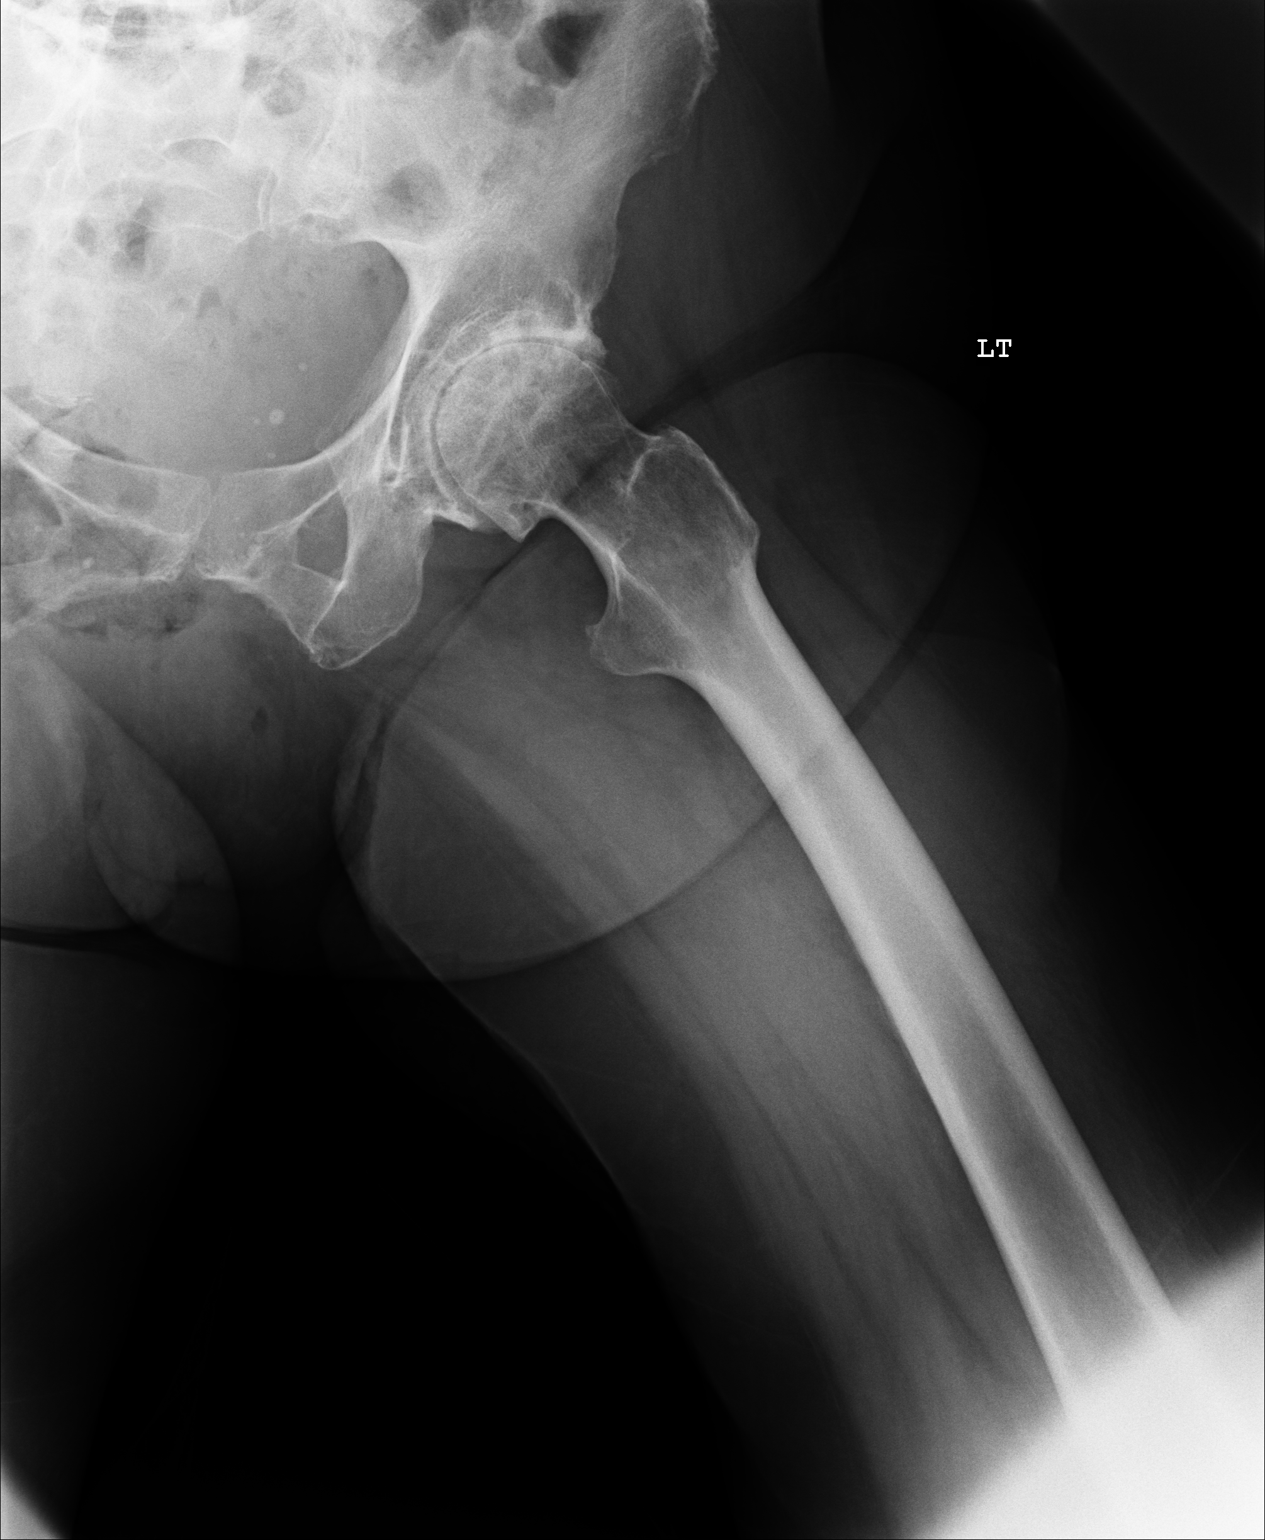

[3 of 3 positions shown; findings below may reference images not displayed]

FINDINGS: There is no evidence of an acute fracture or subluxation. Severe arthritic changes are noted within the hips bilaterally with subchondral increased sclerosis, cystic changes and obliteration of superior joint space bilaterally. Calcifications in the pelvis are likely vascular. There is no soft tissue abnormality.
IMPRESSION: 1.
Severe arthritic changes within the hips bilaterally.

## 2013-11-02 IMAGING — CR XRAY KNEE COMPLETE LT
1 series · 3 of 3 positions shown · non-contrast
Comparison: None.

Exam:   
Bilateral knees 3V
INDICATION: Pain.

[Series 1: view not recorded · oblique · 0.17mm/px · 3 of 3 slices shown]
[im 1/3]
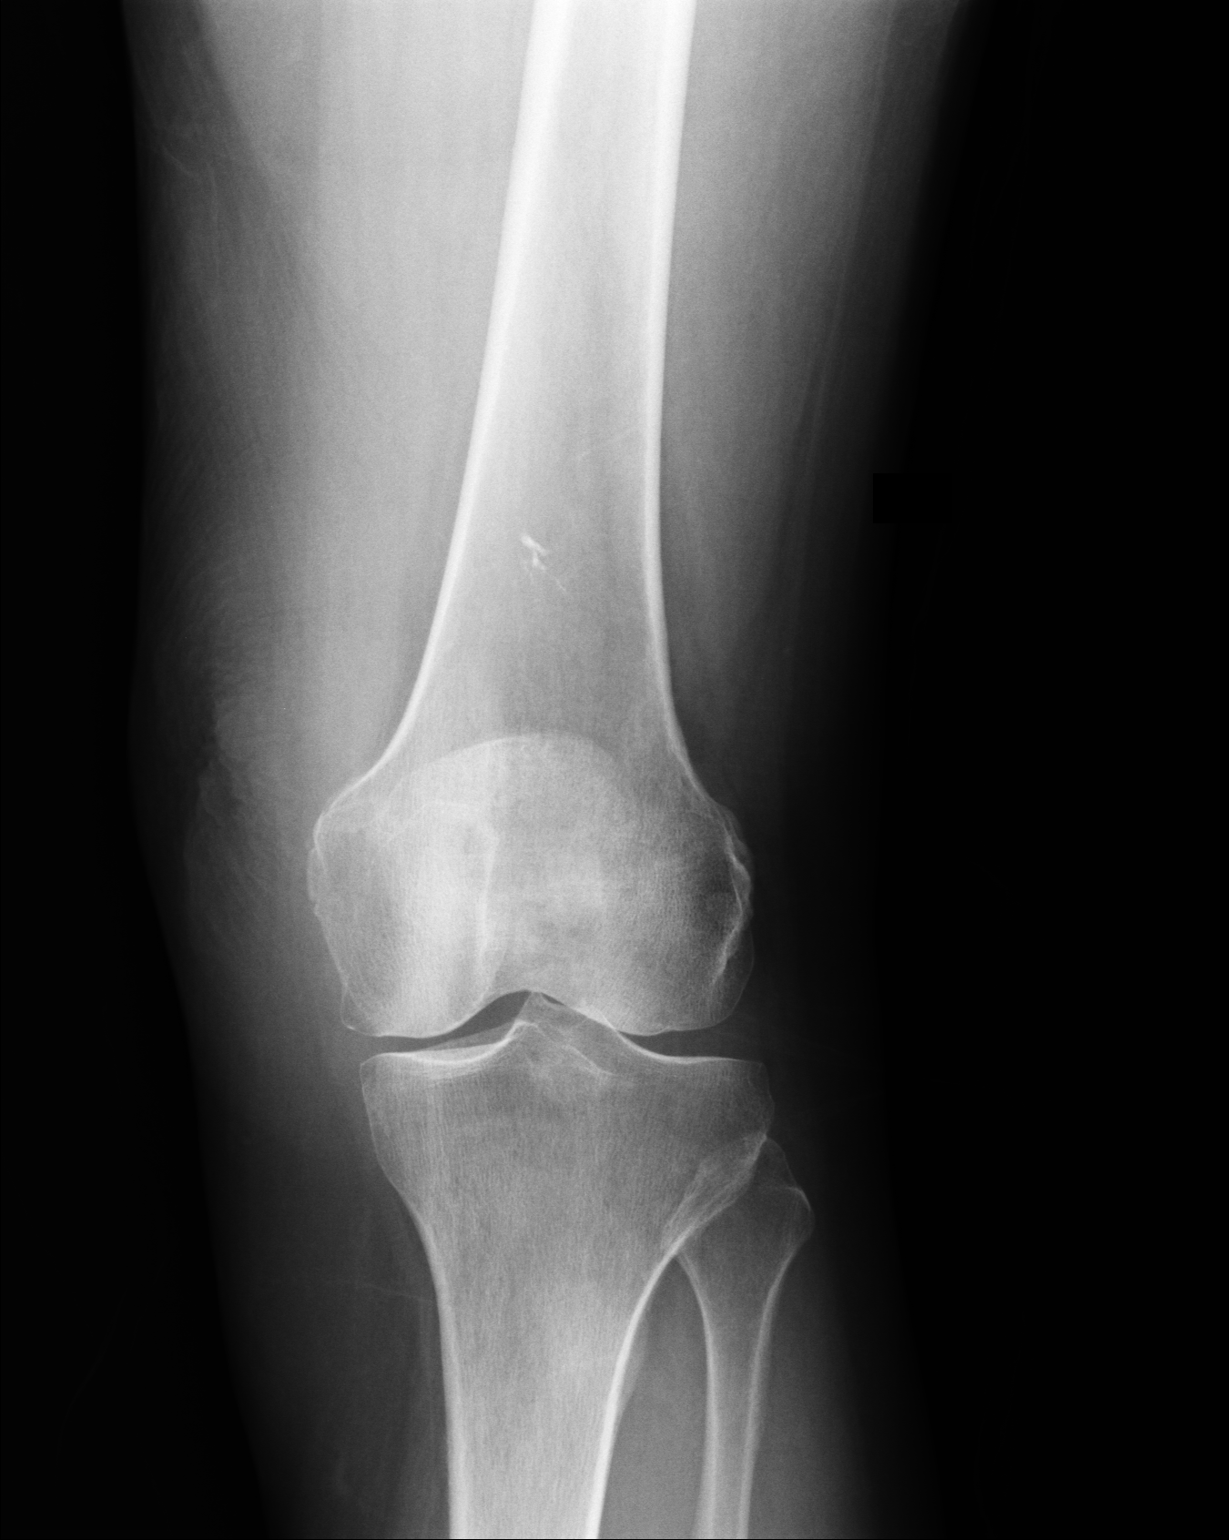
[im 2/3]
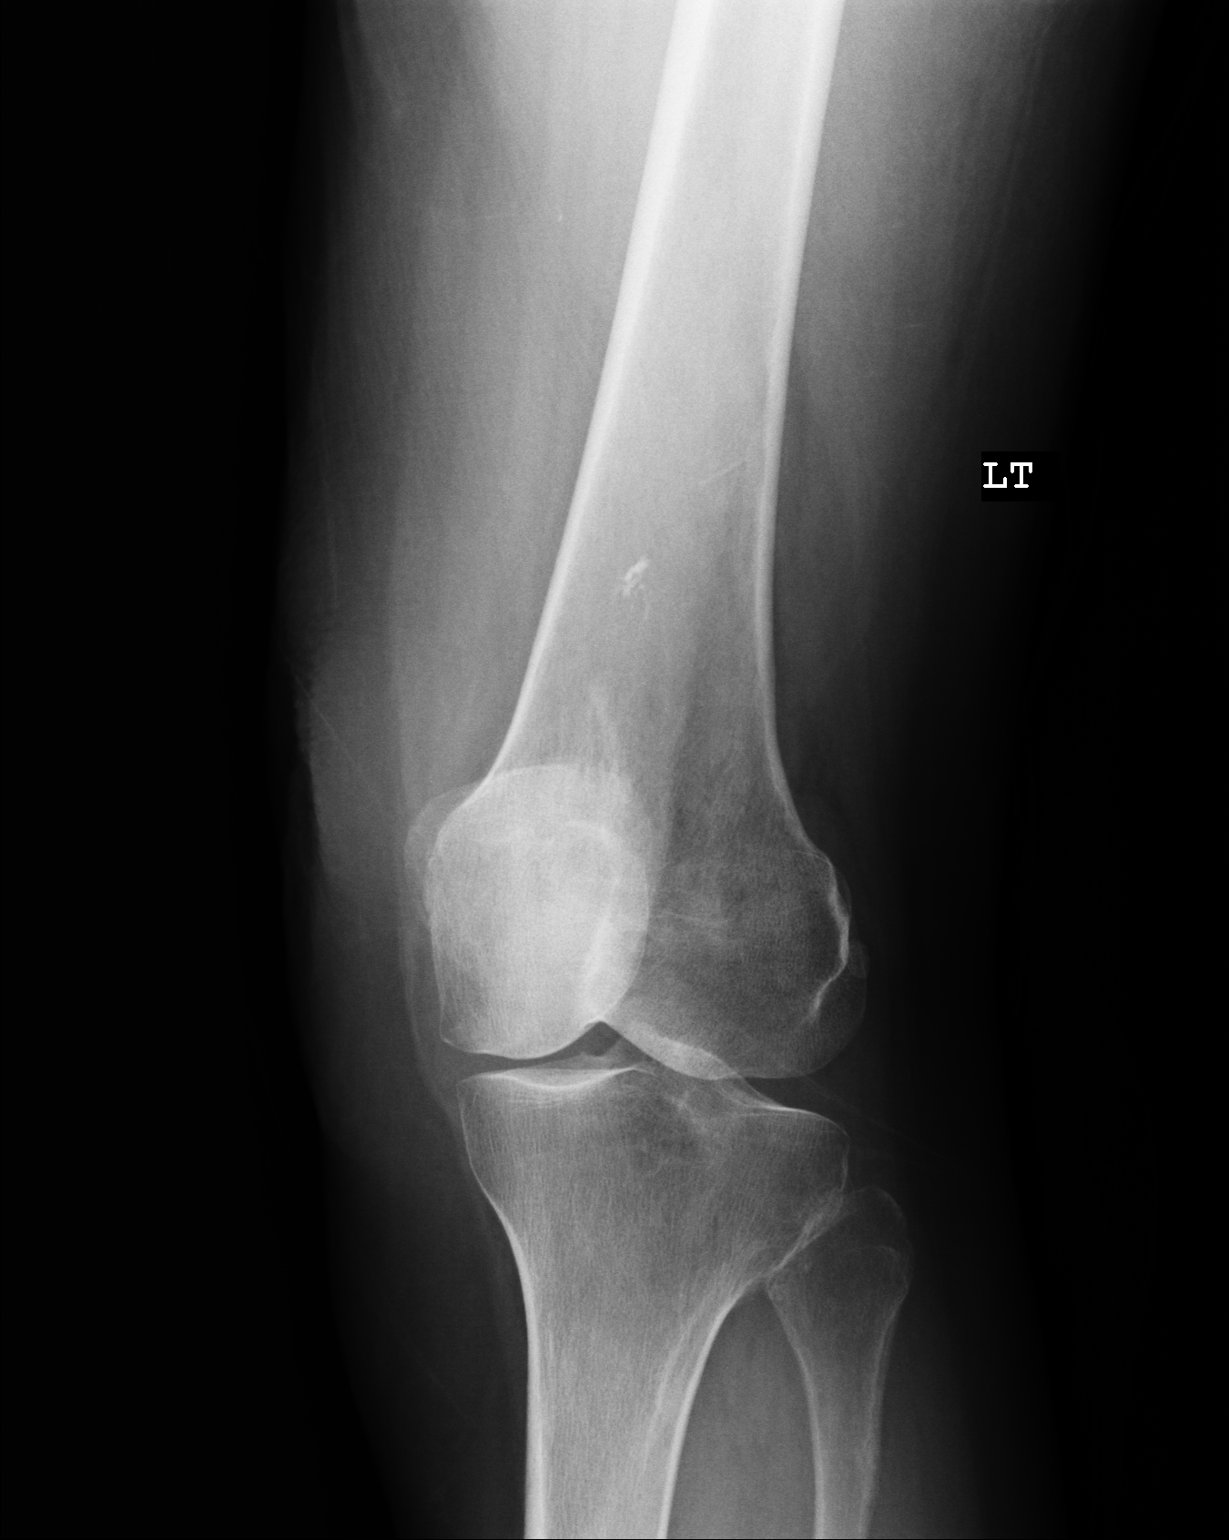
[im 3/3]
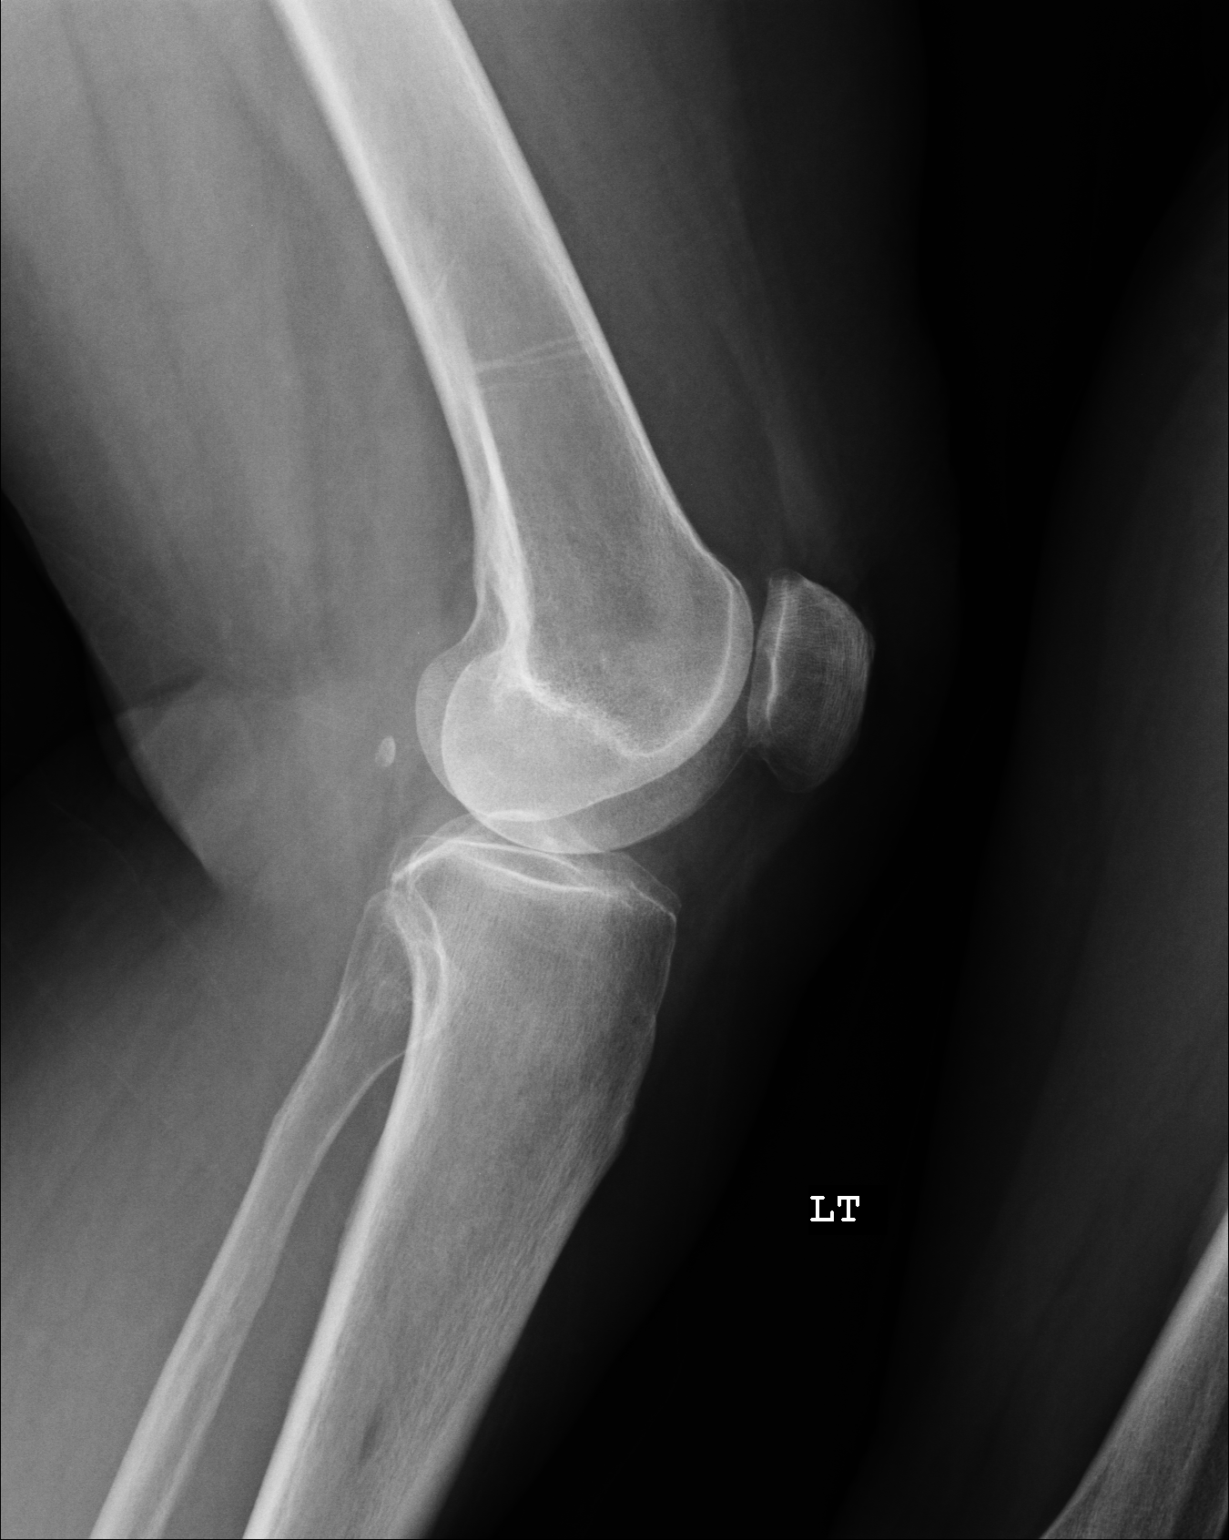

[3 of 3 positions shown; findings below may reference images not displayed]

FINDINGS: The knees are seen in multiple views. No fractures are seen. No joint effusions are appreciated. The joint spaces are preserved.
IMPRESSION: 1.
Negative bilateral knee.

## 2017-05-18 IMAGING — US CAROTID US W/DOPPLER
1 series · 14 of 24 positions shown · non-contrast
Comparison: None available.

EXAM:  CAROTID US W/DOPPLER
INDICATION: TIA.

[Series 5: carotid us w/doppler · 0.07mm/px · 14 of 64 slices shown]
[im 1/64]
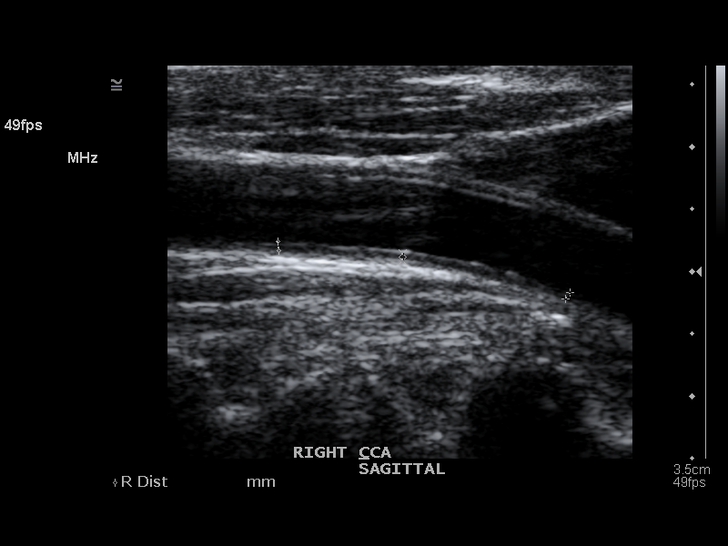
[im 6/64]
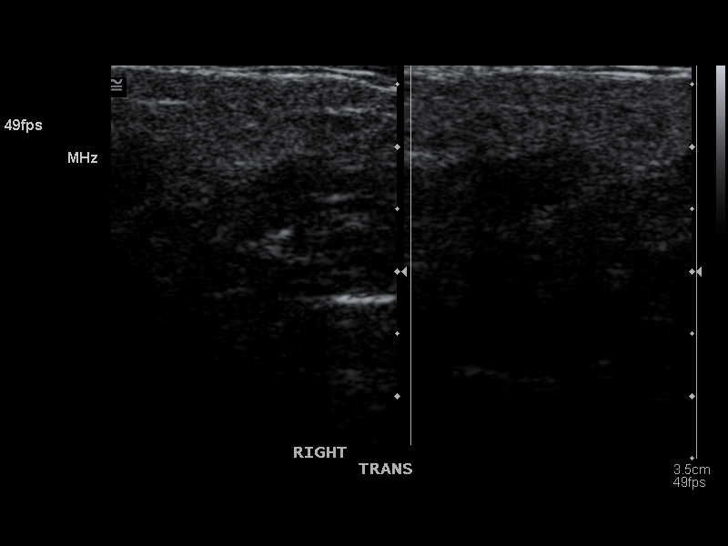
[im 11/64]
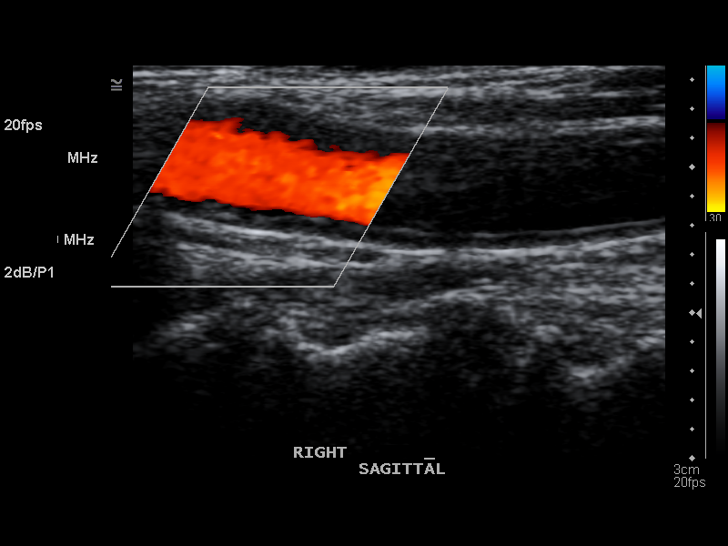
[im 17/64]
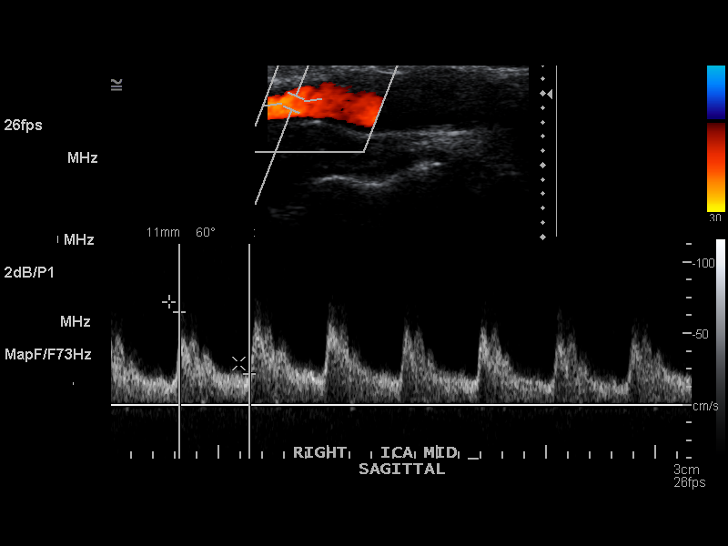
[im 20/64]
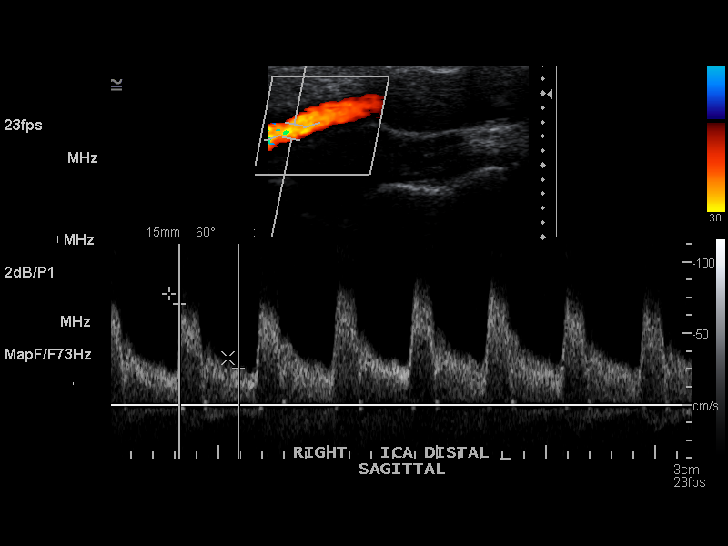
[im 25/64]
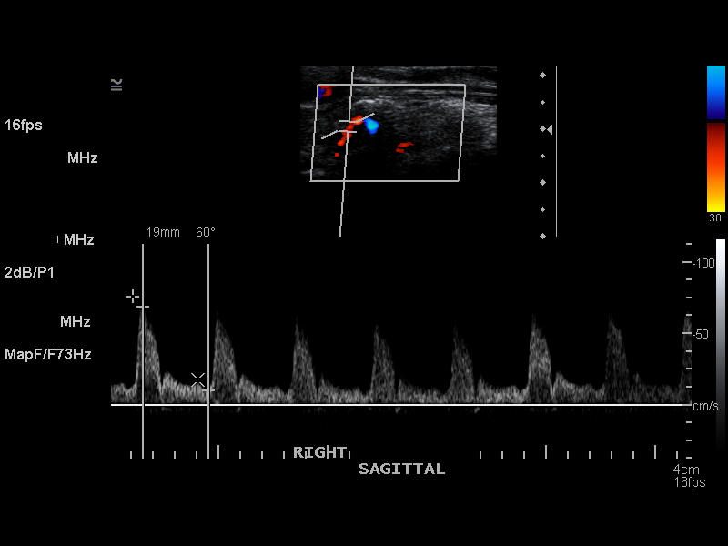
[im 31/64]
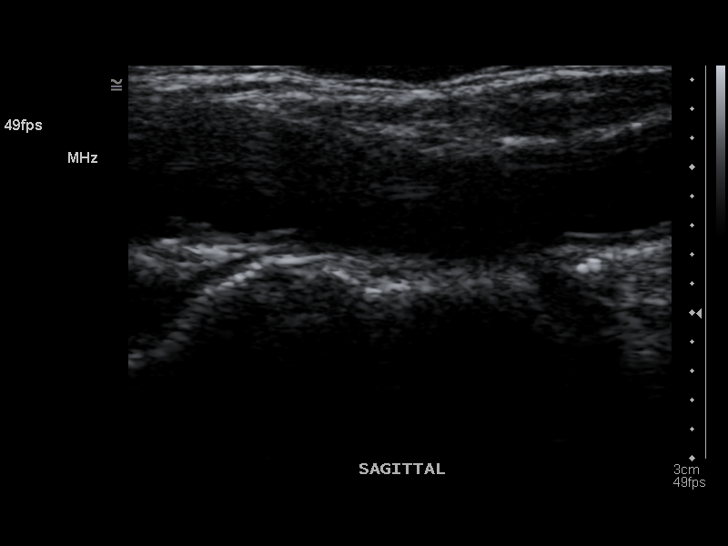
[im 33/64]
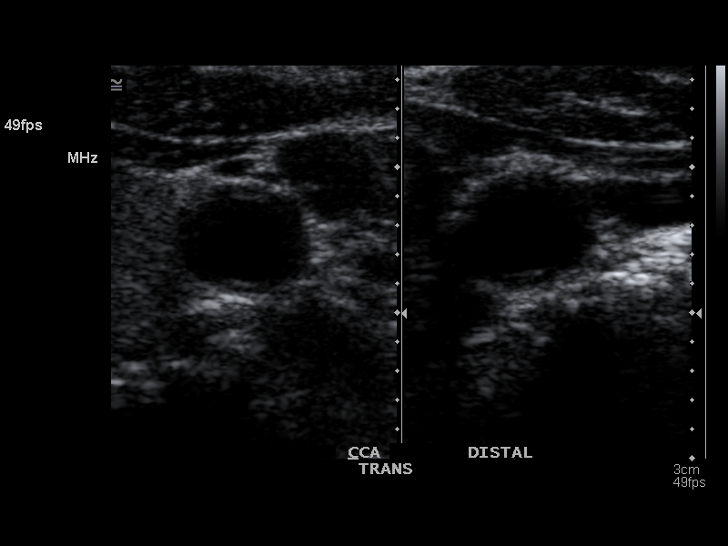
[im 39/64]
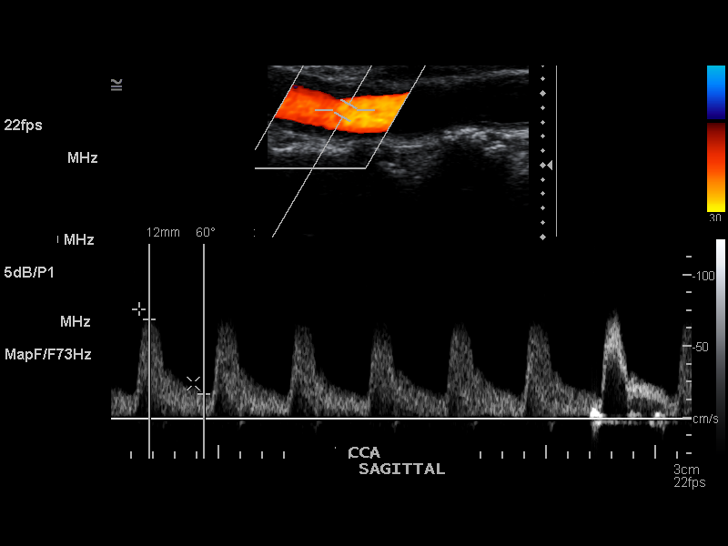
[im 44/64]
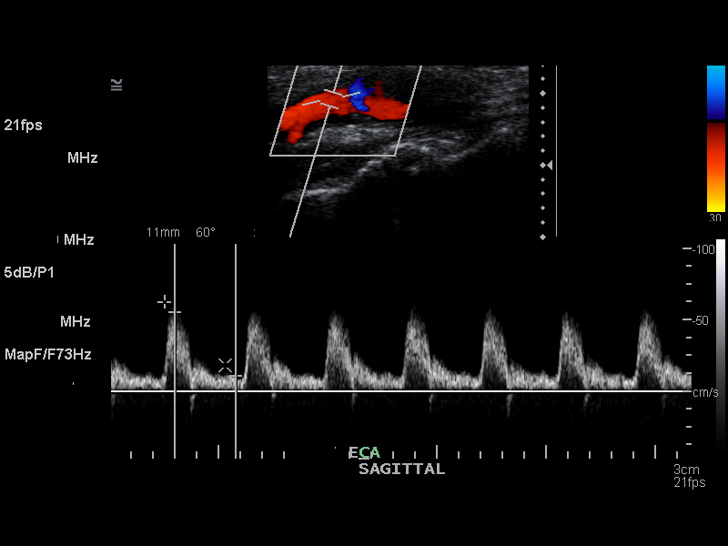
[im 50/64]
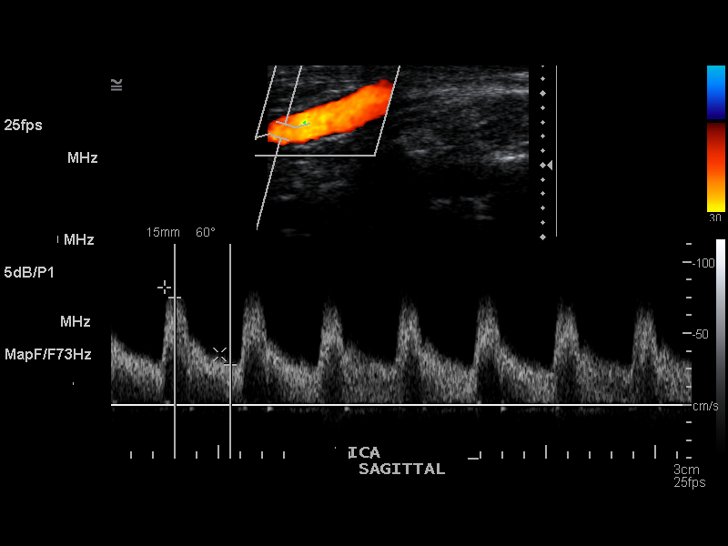
[im 53/64]
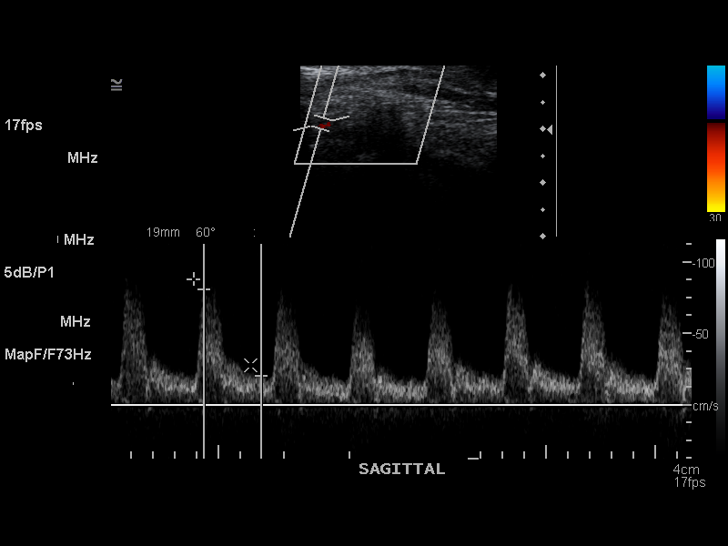
[im 58/64]
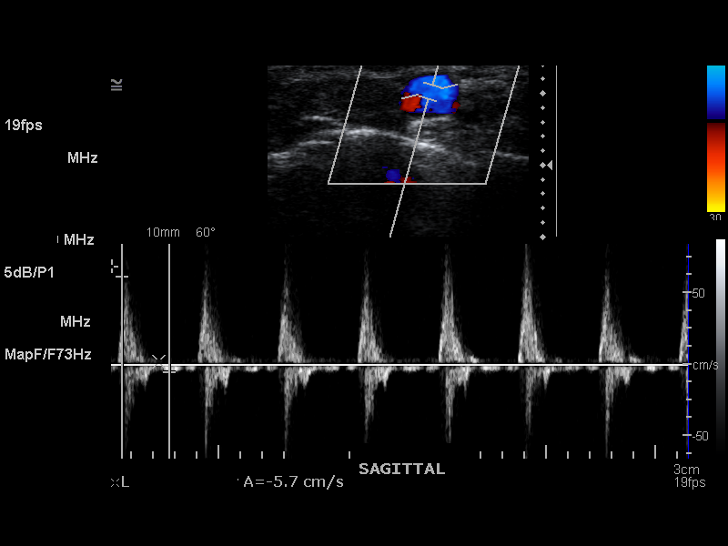
[im 64/64]
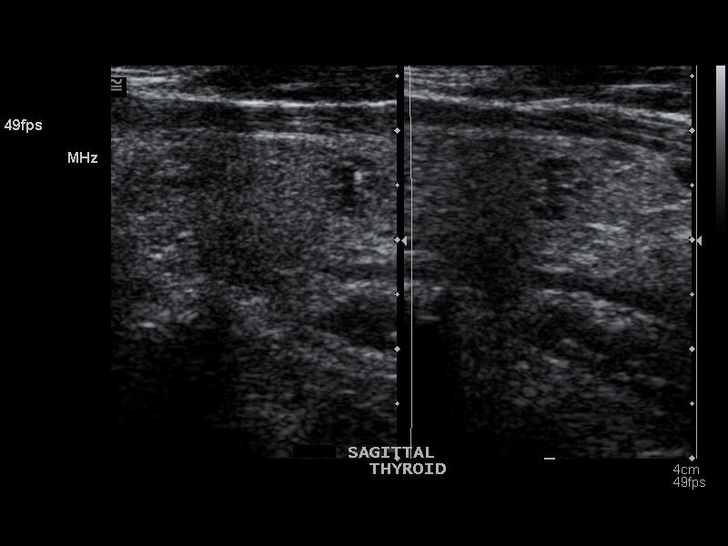

[14 of 24 positions shown; findings below may reference images not displayed]

FINDINGS: Right CCA

83

20

Left CCA

77

17

PSV

IC/CC

EDV

Right Internal

87

1.05

30

Left Internal

85

1.1

29

Right Vertebral

Antegrade

Left Vertebral

Antegrade

There is no significant atherosclerotic plaque within the carotid bulbs and internal carotid arteries. No hemodynamically significant stenosis or abnormal elevation of velocity is seen at any level. Both vertebral arteries are patent with appropriate antegrade direction of flow.
IMPRESSION: No hemodynamically significant stenosis within the internal carotid arteries as per NASCET criteria.

## 2021-02-27 IMAGING — US US ABDOMEN COMPLETE
1 series · 14 of 25 positions shown · non-contrast
Comparison: CT scan abdomen and pelvis dated 02/14/2021.

﻿EXAM:  [id number]   US ABDOMEN COMPLETE, ATTENTION ABDOMINAL AORTA
INDICATION: Abdominal pain.  Severe atherosclerotic aorta of the abdominal aorta noted on CT examination.

[Series 1: us abdomen complete · 14 of 63 slices shown]
[im 1/63]
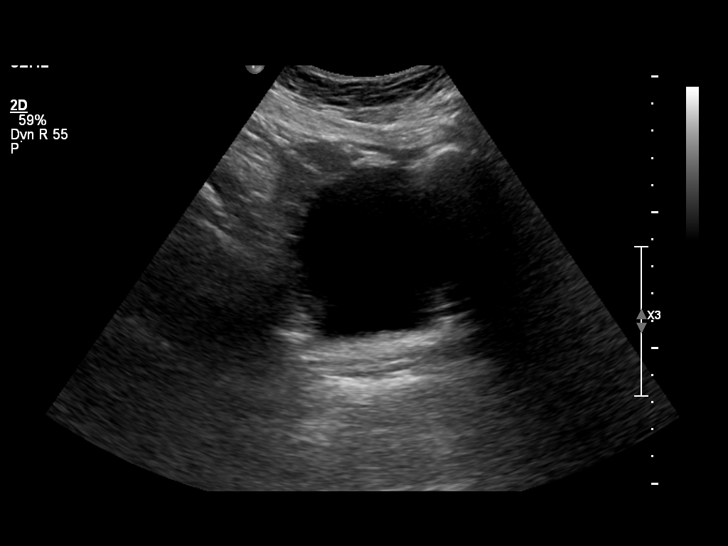
[im 6/63]
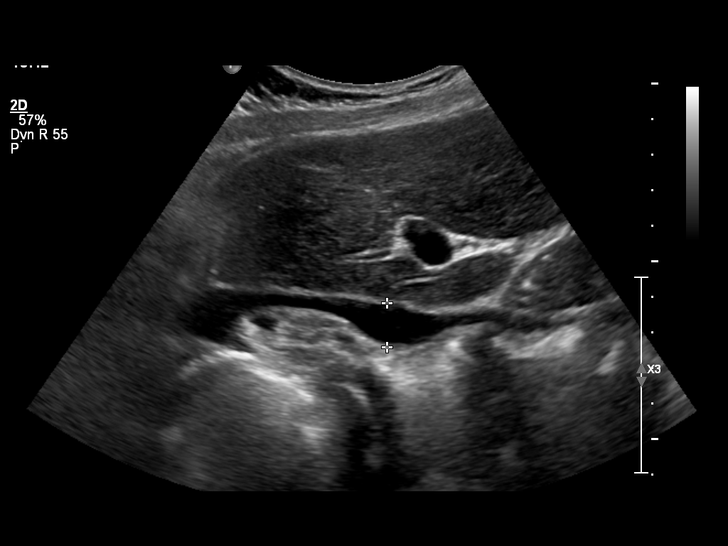
[im 11/63]
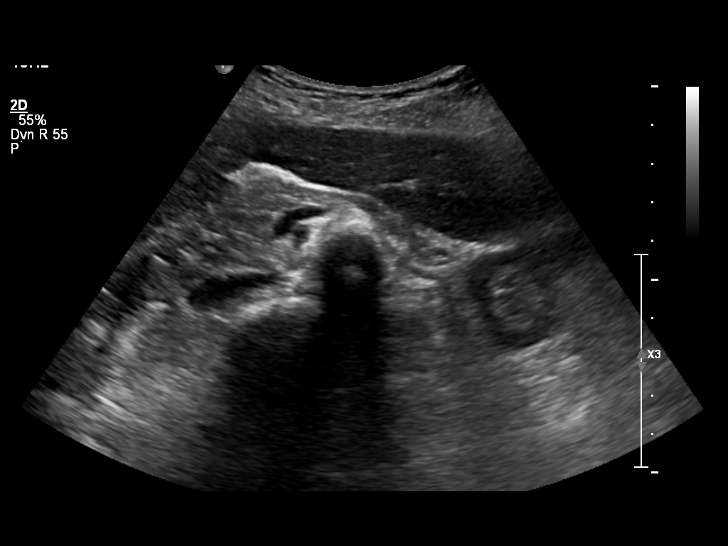
[im 16/63]
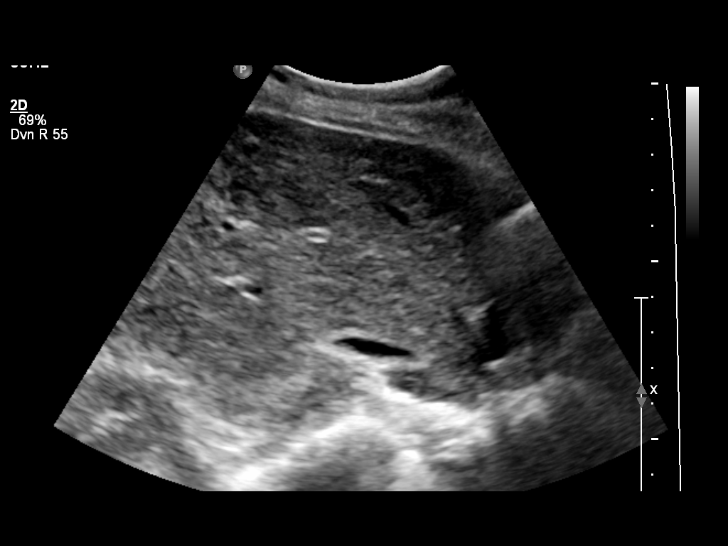
[im 21/63]
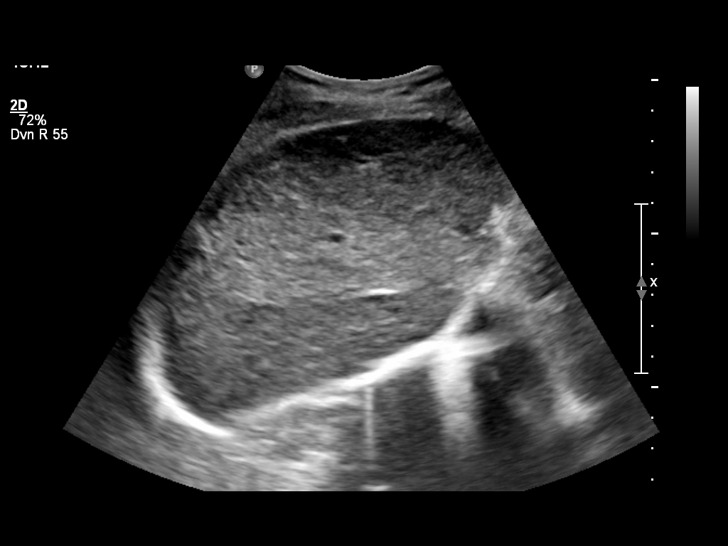
[im 24/63]
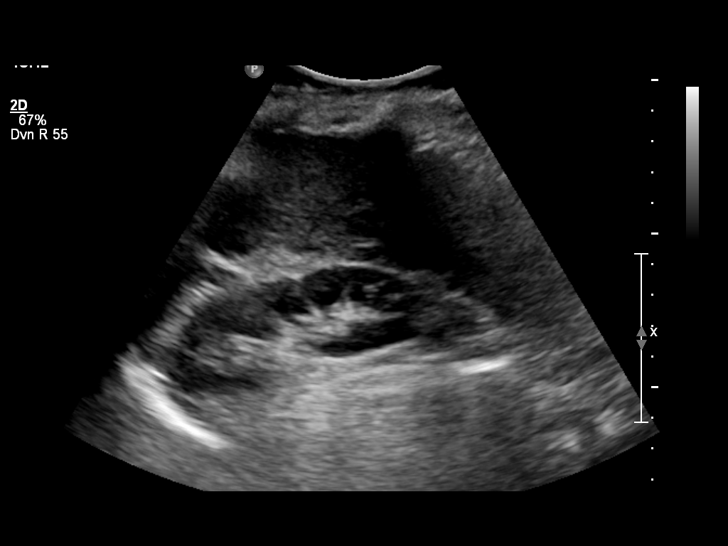
[im 29/63]
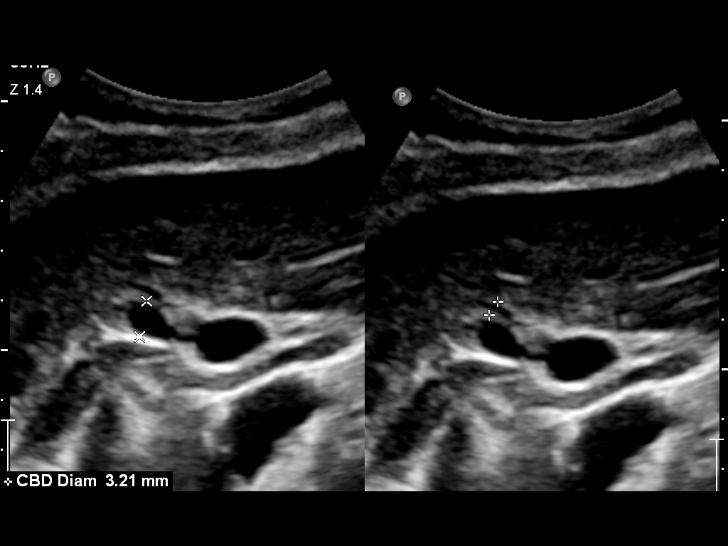
[im 34/63]
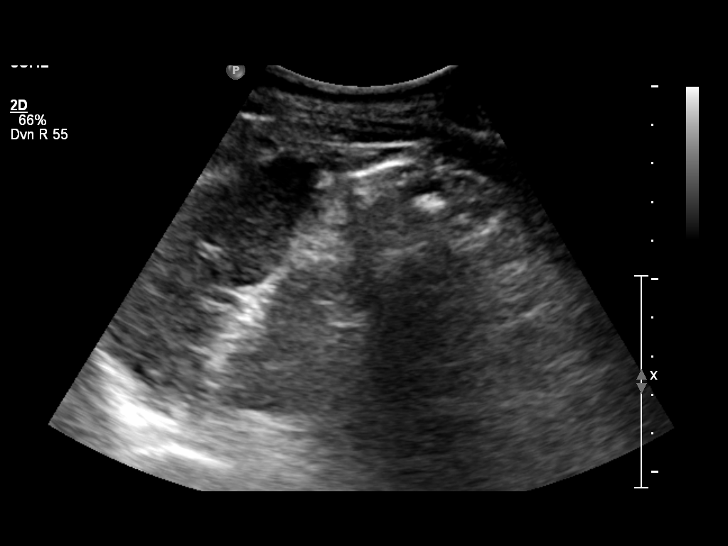
[im 39/63]
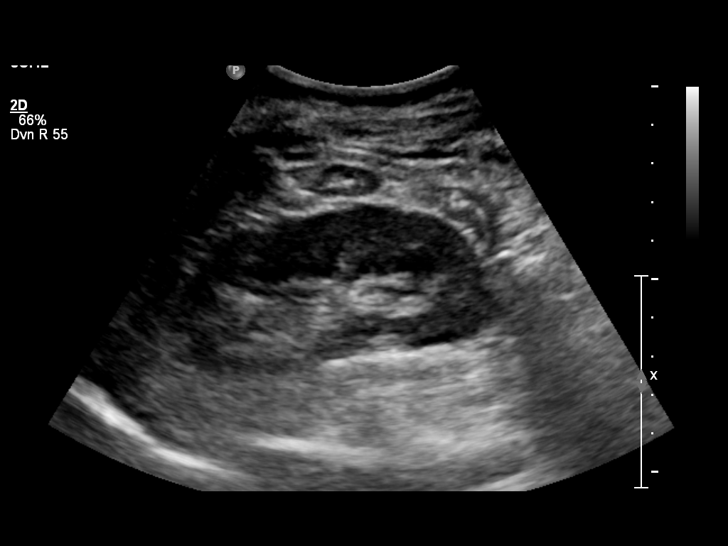
[im 42/63]
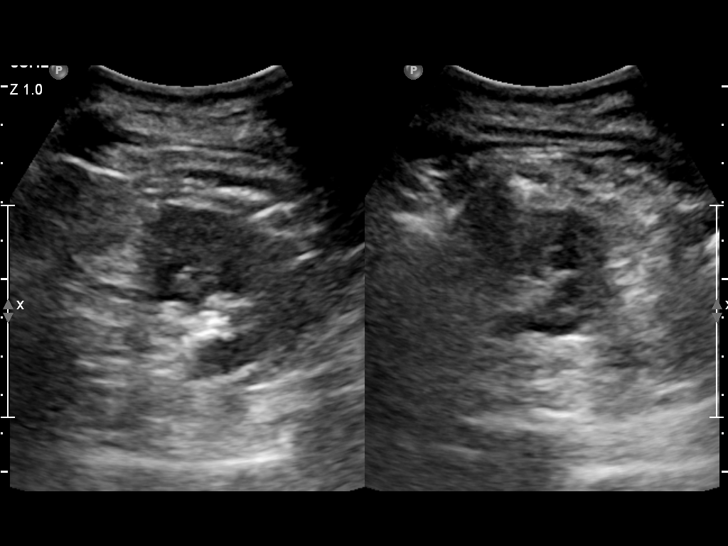
[im 47/63]
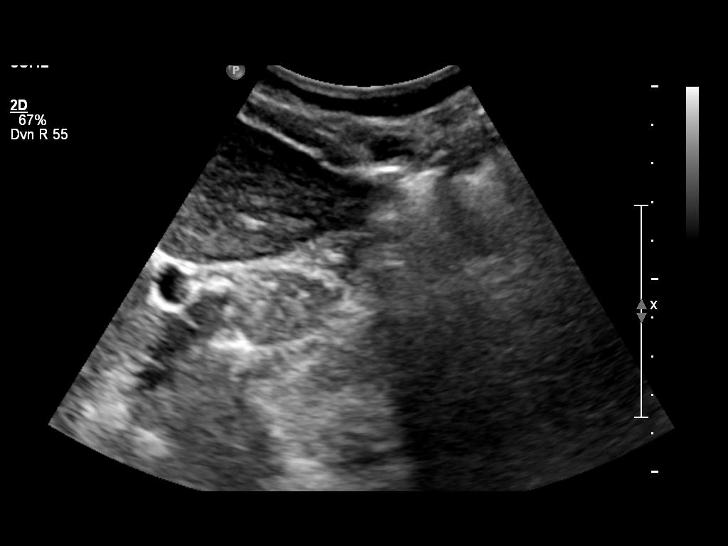
[im 52/63]
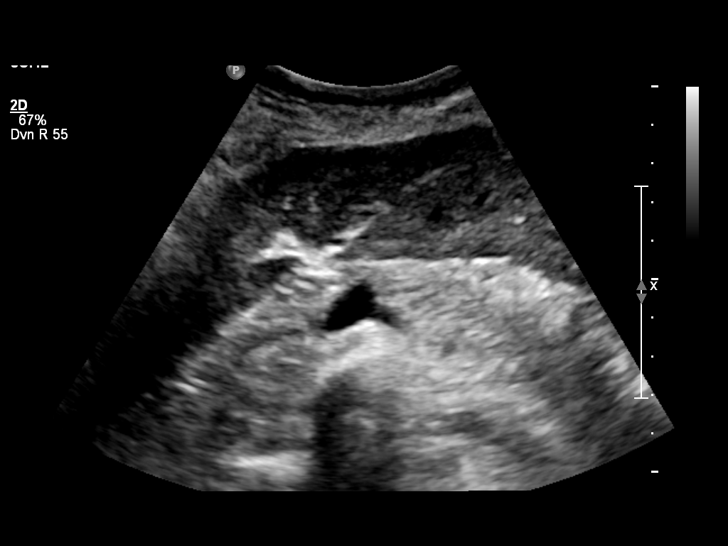
[im 57/63]
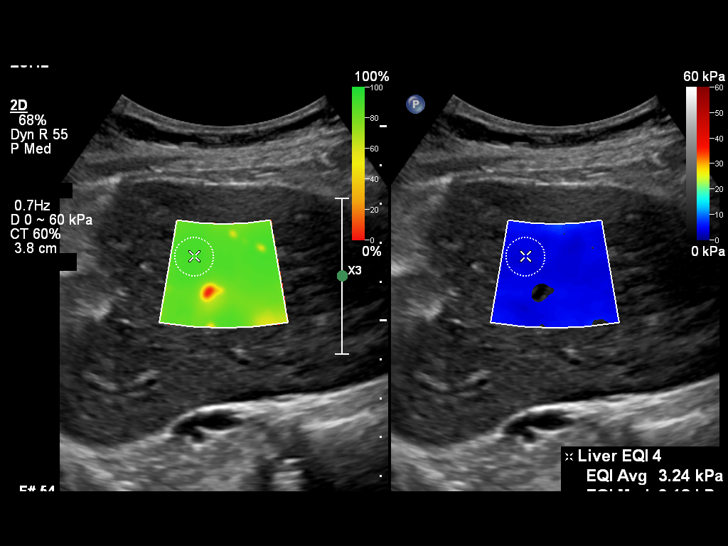
[im 63/63]
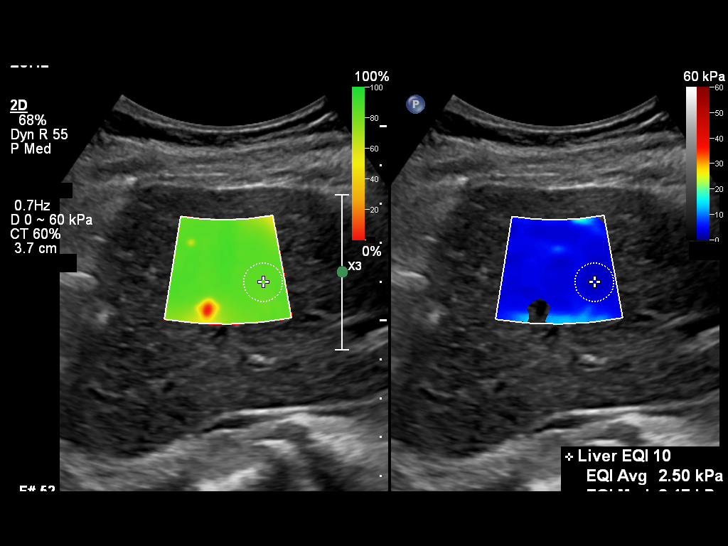

[14 of 25 positions shown; findings below may reference images not displayed]

FINDINGS: The gallbladder is not well visualized.  Liver measures 13 cm in length and spleen 8.7 cm.  Portal vein and hepatic veins are patent.  Kidneys are normal.  Pancreas is poorly visualized.  Atherosclerotic changes of abdominal aorta are noted without evidence of aneurysm or ectasia.  Maximum AP diameter of the mid abdomen measures 19 mm.  Vena cava is normal.  No free fluid is seen.
IMPRESSION: 1. Atherosclerotic changes of abdominal aorta.  No evidence of aneurysm or ectasia.  

2. No acute findings are noted in the upper abdomen on ultrasound examination.  No free fluid.

## 2021-08-13 ENCOUNTER — Encounter (HOSPITAL_BASED_OUTPATIENT_CLINIC_OR_DEPARTMENT_OTHER): Payer: Self-pay

## 2021-08-13 ENCOUNTER — Emergency Department (HOSPITAL_BASED_OUTPATIENT_CLINIC_OR_DEPARTMENT_OTHER): Payer: Medicare Other

## 2021-08-13 ENCOUNTER — Other Ambulatory Visit: Payer: Self-pay

## 2021-08-13 ENCOUNTER — Emergency Department
Admission: EM | Admit: 2021-08-13 | Discharge: 2021-08-13 | Disposition: A | Discharge status: Home / Self Care | Payer: Medicare Other | Attending: FAMILY PRACTICE | Admitting: FAMILY PRACTICE

## 2021-08-13 DIAGNOSIS — L03116 Cellulitis of left lower limb: Secondary | ICD-10-CM | POA: Insufficient documentation

## 2021-08-13 DIAGNOSIS — I10 Essential (primary) hypertension: Secondary | ICD-10-CM | POA: Insufficient documentation

## 2021-08-13 DIAGNOSIS — S8012XD Contusion of left lower leg, subsequent encounter: Secondary | ICD-10-CM

## 2021-08-13 DIAGNOSIS — S8012XA Contusion of left lower leg, initial encounter: Secondary | ICD-10-CM | POA: Insufficient documentation

## 2021-08-13 DIAGNOSIS — W19XXXA Unspecified fall, initial encounter: Secondary | ICD-10-CM | POA: Insufficient documentation

## 2021-08-13 HISTORY — DX: Unspecified macular degeneration: H35.30

## 2021-08-13 HISTORY — DX: Cerebral infarction, unspecified (CMS HCC): I63.9

## 2021-08-13 HISTORY — DX: Other specified viral diseases: B33.8

## 2021-08-13 HISTORY — DX: Essential (primary) hypertension: I10

## 2021-08-13 HISTORY — DX: Diverticulosis of large intestine without perforation or abscess without bleeding: K57.30

## 2021-08-13 MED ORDER — DOXYCYCLINE HYCLATE 100 MG CAPSULE
100.0000 mg | ORAL_CAPSULE | Freq: Two times a day (BID) | ORAL | 0 refills | Status: AC
Start: 2021-08-13 — End: 2021-08-23

## 2021-08-13 NOTE — ED Nurses Note (Addendum)
2 cm x 2  cm scabbed area noted at left lower leg. Surrounded by reddened area. 1 cm x 1 cm scabbed area noted above 2 cm area. Reddened area marked. See pictures. 8 cm x 8 cm x 8 cm  marked area Capillary refill less than 2 seconds.  + CMS. Pt moving left  toes foot and ankle without difficulty. Reddened area tender to touch. Pt completed Cleocin  PO. States, "The redness and tenderness has gotten worse over last couple of days." Pt  states, "My blood pressure goes up when I'm in a hospital . I have Lisinopril 5 mg at home . I take it if my B/P gets above 150 on top"

## 2021-08-13 NOTE — Discharge Instructions (Signed)
With cellulitis of the leg I do want to keep it elevated above the level of the hard a few not actively walking.  There is a prescription for doxycycline to take twice daily for 10 days, call follow-up with your primary care provider as soon as feasible possible.  Take your medication with full glass of water to prevent the medication from sticking in her throat and causing esophagitis.  Continue other home medications, return to the ER symptoms change worsen or fail to continue improvement.

## 2021-08-13 NOTE — ED Nurses Note (Signed)
Pt DC home ambulatory with cane. Friend to pick up pt. Prescription x 1 e-scribed. Verbal and written instructions given. Voices understanding

## 2021-08-13 NOTE — ED Provider Notes (Signed)
[place] [facility] Emergency Department  ED Primary Provider Note  History of Present Illness   Chief Complaint   Patient presents with   . Abscess Re-evaluation   . Fall     18 days      Emily Gonzalez is a 88 y.o. female who had concerns including Abscess Re-evaluation and Fall.  Arrival: The patient arrived by Car    This 88 year old female patient presents emergency department with 2 lesions on left lower leg after falling on a rock couple weeks ago and yd.  She was seen [facility] and given 10 day course of clindamycin for the leg wounds.  She is no fevers sweats chills nausea vomiting body aches joint aches beyond the norm.  She does state that she is been offer clindamycin for about 3 days, has noted from yesterday to this morning that the erythema in the lateral aspect of her left lower leg has extended up toward and close to the demarcations from the markers that was placed by [facility] over 10 days ago.  She did have some loose stools with the clindamycin.    Patient has been COVID influenza vaccinated.        Review of Systems   Pertinent positive and negative ROS as per HPI.  Historical Data   History Reviewed This Encounter: Medical History  Surgical History  Family History  Social History      Physical Exam   ED Triage Vitals [08/13/21 1033]   BP (Non-Invasive) (!) 185/117   Heart Rate (!) 104   Respiratory Rate 18   Temperature 37 C (98.6 F)   SpO2 97 %   Weight 63.5 kg (140 lb)   Height 1.575 m (5\' 2" )     Physical Exam   General: No acute distress, nontoxic   Left leg:  No swelling of the left leg noted, there is erythema to the distal aspect of the tib-fib just above the ankle, there is a 1 cm in greatest dimension scabbed area on the anterolateral aspect of the lower leg, there is a 2.5 cm scabbed area on the lateral or fibular aspect of the lower leg, with erythema that appears to be extending per the patient.  No drainage noted.  Neurovascular:   Good sensory distant, she has posterior tibial dorsalis pedis pulses palpable.      Patient Data   Labs Ordered/Reviewed - No data to display  No orders to display     Medical Decision Making        Medical Decision Making  Moderate complexity, straightforward    Risk  Prescription drug management.                  Clinical Impression   Contusion of left leg, subsequent encounter (Primary)   Cellulitis of left leg   Essential hypertension       Disposition: Discharged    . Marland Kitchen, DO

## 2021-08-13 NOTE — ED Triage Notes (Signed)
Injury to left lower leg now was 18 days from fall. She struck sharp rock and seen [facility] given antibiotic. Clindamycin (30 pills). Anterior wound is improving but not on lateral side.

## 2021-09-03 ENCOUNTER — Other Ambulatory Visit: Payer: Medicare Other | Attending: FAMILY PRACTICE

## 2021-09-03 ENCOUNTER — Other Ambulatory Visit: Payer: Self-pay

## 2021-09-03 DIAGNOSIS — L03116 Cellulitis of left lower limb: Secondary | ICD-10-CM | POA: Insufficient documentation

## 2021-09-03 DIAGNOSIS — S81812A Laceration without foreign body, left lower leg, initial encounter: Secondary | ICD-10-CM | POA: Insufficient documentation

## 2021-09-03 LAB — BASIC METABOLIC PANEL
ANION GAP: 7 mmol/L — ABNORMAL LOW (ref 10–20)
BUN/CREA RATIO: 14 (ref 6–22)
BUN: 15 mg/dL (ref 7–25)
CALCIUM: 9.7 mg/dL (ref 8.6–10.3)
CHLORIDE: 105 mmol/L (ref 98–107)
CO2 TOTAL: 27 mmol/L (ref 21–31)
CREATININE: 1.04 mg/dL (ref 0.60–1.30)
ESTIMATED GFR: 51 mL/min/{1.73_m2} — ABNORMAL LOW (ref >59)
GLUCOSE: 86 mg/dL (ref 74–109)
OSMOLALITY, CALCULATED: 278 mOsm/kg (ref 270–290)
POTASSIUM: 4.4 mmol/L (ref 3.5–5.1)
SODIUM: 139 mmol/L (ref 136–145)

## 2021-09-03 LAB — CBC WITH DIFF
BASOPHIL #: 0 10*3/uL (ref 0.00–0.30)
BASOPHIL %: 1 % (ref 0–3)
EOSINOPHIL #: 0.3 10*3/uL (ref 0.00–0.80)
EOSINOPHIL %: 4 % (ref 0–7)
HCT: 40.8 % (ref 37.0–47.0)
HGB: 13.8 g/dL (ref 12.5–16.0)
LYMPHOCYTE #: 2.5 10*3/uL (ref 1.10–5.00)
LYMPHOCYTE %: 33 % (ref 25–45)
MCH: 30 pg (ref 27.0–32.0)
MCHC: 33.9 g/dL (ref 32.0–36.0)
MCV: 88.6 fL (ref 78.0–99.0)
MONOCYTE #: 1 10*3/uL (ref 0.00–1.30)
MONOCYTE %: 14 % — ABNORMAL HIGH (ref 0–12)
MPV: 8.9 fL (ref 7.4–10.4)
NEUTROPHIL #: 3.6 10*3/uL (ref 1.80–8.40)
NEUTROPHIL %: 49 % (ref 40–76)
PLATELETS: 277 10*3/uL (ref 140–440)
RBC: 4.61 10*6/uL (ref 4.20–5.40)
RDW: 13.4 % (ref 11.6–14.8)
WBC: 7.4 10*3/uL (ref 4.0–10.5)
WBCS UNCORRECTED: 7.4 10*3/uL

## 2021-09-03 LAB — HEPATIC FUNCTION PANEL
ALBUMIN/GLOBULIN RATIO: 1.6 — ABNORMAL HIGH (ref 0.8–1.4)
ALBUMIN: 4.2 g/dL (ref 3.5–5.7)
ALKALINE PHOSPHATASE: 67 U/L (ref 34–104)
ALT (SGPT): 16 U/L (ref 7–52)
AST (SGOT): 24 U/L (ref 13–39)
BILIRUBIN DIRECT: 0.13 md/dL (ref <=0.20)
BILIRUBIN TOTAL: 1 mg/dL (ref 0.3–1.2)
BILIRUBIN, INDIRECT: 0.87 mg/dL (ref <=1)
GLOBULIN: 2.7 — ABNORMAL LOW (ref 2.9–5.4)
PROTEIN TOTAL: 6.9 g/dL (ref 6.4–8.9)

## 2021-09-03 LAB — LACTIC ACID LEVEL: LACTIC ACID: 1.2 mmol/L (ref 0.4–2.0)

## 2021-09-13 ENCOUNTER — Other Ambulatory Visit: Payer: Self-pay

## 2021-09-13 ENCOUNTER — Other Ambulatory Visit: Payer: Medicare Other | Attending: FAMILY PRACTICE

## 2021-09-13 DIAGNOSIS — R42 Dizziness and giddiness: Secondary | ICD-10-CM | POA: Insufficient documentation

## 2021-09-13 DIAGNOSIS — I951 Orthostatic hypotension: Secondary | ICD-10-CM | POA: Insufficient documentation

## 2021-09-13 DIAGNOSIS — L03116 Cellulitis of left lower limb: Secondary | ICD-10-CM | POA: Insufficient documentation

## 2021-09-13 LAB — BASIC METABOLIC PANEL
ANION GAP: 7 mmol/L — ABNORMAL LOW (ref 10–20)
BUN/CREA RATIO: 15 (ref 6–22)
BUN: 15 mg/dL (ref 7–25)
CALCIUM: 9.6 mg/dL (ref 8.6–10.3)
CHLORIDE: 105 mmol/L (ref 98–107)
CO2 TOTAL: 25 mmol/L (ref 21–31)
CREATININE: 1.01 mg/dL (ref 0.60–1.30)
ESTIMATED GFR: 53 mL/min/{1.73_m2} — ABNORMAL LOW (ref >59)
GLUCOSE: 95 mg/dL (ref 74–109)
OSMOLALITY, CALCULATED: 275 mOsm/kg (ref 270–290)
POTASSIUM: 4.1 mmol/L (ref 3.5–5.1)
SODIUM: 137 mmol/L (ref 136–145)

## 2021-09-13 LAB — CBC WITH DIFF
BASOPHIL #: 0 10*3/uL (ref 0.00–0.30)
BASOPHIL %: 1 % (ref 0–3)
EOSINOPHIL #: 0.3 10*3/uL (ref 0.00–0.80)
EOSINOPHIL %: 4 % (ref 0–7)
HCT: 40.4 % (ref 37.0–47.0)
HGB: 13.7 g/dL (ref 12.5–16.0)
LYMPHOCYTE #: 2.9 10*3/uL (ref 1.10–5.00)
LYMPHOCYTE %: 37 % (ref 25–45)
MCH: 29.6 pg (ref 27.0–32.0)
MCHC: 33.8 g/dL (ref 32.0–36.0)
MCV: 87.7 fL (ref 78.0–99.0)
MONOCYTE #: 1.1 10*3/uL (ref 0.00–1.30)
MONOCYTE %: 14 % — ABNORMAL HIGH (ref 0–12)
MPV: 8.4 fL (ref 7.4–10.4)
NEUTROPHIL #: 3.5 10*3/uL (ref 1.80–8.40)
NEUTROPHIL %: 44 % (ref 40–76)
PLATELETS: 296 10*3/uL (ref 140–440)
RBC: 4.61 10*6/uL (ref 4.20–5.40)
RDW: 13.1 % (ref 11.6–14.8)
WBC: 7.9 10*3/uL (ref 4.0–10.5)
WBCS UNCORRECTED: 7.9 10*3/uL

## 2021-09-13 LAB — LDH: LDH: 186 U/L (ref 140–271)

## 2021-09-13 LAB — MAGNESIUM: MAGNESIUM: 2.1 mg/dL (ref 1.9–2.7)

## 2021-09-15 LAB — WOUND, SUPERFICIAL/NON-STERILE SITE, AEROBIC CULTURE AND GRAM STAIN

## 2021-09-27 ENCOUNTER — Other Ambulatory Visit: Payer: Self-pay

## 2021-09-27 ENCOUNTER — Encounter (HOSPITAL_BASED_OUTPATIENT_CLINIC_OR_DEPARTMENT_OTHER): Payer: Self-pay

## 2021-09-27 ENCOUNTER — Emergency Department
Admission: EM | Admit: 2021-09-27 | Discharge: 2021-09-27 | Disposition: A | Discharge status: Home / Self Care | Payer: Medicare Other | Attending: Emergency Medicine | Admitting: Emergency Medicine

## 2021-09-27 DIAGNOSIS — I8390 Asymptomatic varicose veins of unspecified lower extremity: Secondary | ICD-10-CM

## 2021-09-27 DIAGNOSIS — I739 Peripheral vascular disease, unspecified: Secondary | ICD-10-CM | POA: Insufficient documentation

## 2021-09-27 DIAGNOSIS — I8393 Asymptomatic varicose veins of bilateral lower extremities: Secondary | ICD-10-CM | POA: Insufficient documentation

## 2021-09-27 NOTE — ED Nurses Note (Signed)
Lt ankle wrapped with ace banadge

## 2021-09-27 NOTE — ED Provider Notes (Signed)
[place], [facility] Emergency Department  ED Primary Provider Note  History of Present Illness   Chief Complaint   Patient presents with   . Leg Swelling     Emily Gonzalez is a 88 y.o. female who had concerns including Leg Swelling.  Arrival: The patient arrived by Car complaining of swelling to the ankle for several weeks.  Patient initially fell and had infected wound over the left lower leg.  Patient was treated with clindamycin for 10 days and then doxycycline.  Patient went to see her PMD and was told to get an ultrasound of her lower leg.  Patient had an appointment at 6:00 a.m. at [facility] but refused to go there.  Patient instead came to [facility] at 5:40 p.m.Marland KitchenMarland Kitchen  Patient denies any fever or chills.  Patient denies any redness or warmth to the area.  Patient denies any swelling of the left calf.  In fact she has no calf tenderness.  Patient denies any shortness of breath or chest pain.  Patient states she feels perfectly fine other than still having swelling to her lower leg.  Patient does have peripheral vascular disease.    HPI  Review of Systems   Review of Systems   Constitutional: Positive for activity change. Negative for chills and fever.   HENT: Negative for ear pain and sore throat.    Eyes: Negative for pain and visual disturbance.   Respiratory: Negative for cough and shortness of breath.    Cardiovascular: Negative for chest pain and palpitations.   Gastrointestinal: Negative for abdominal pain and vomiting.   Genitourinary: Negative for dysuria and hematuria.   Musculoskeletal: Positive for arthralgias, gait problem and joint swelling. Negative for back pain.   Skin: Negative for color change and rash.   Neurological: Negative for seizures and syncope.   All other systems reviewed and are negative.     Historical Data   History Reviewed This Encounter:     Physical Exam   ED Triage Vitals [09/27/21 1742]   BP (Non-Invasive) (!) 197/110   Heart Rate 97    Respiratory Rate 16   Temperature 36.6 C (97.8 F)   SpO2 99 %   Weight 62.6 kg (138 lb)   Height 1.6 m (5\' 3" )     Physical Exam  Vitals and nursing note reviewed.   Constitutional:       General: She is not in acute distress.     Appearance: She is well-developed. She is obese.   HENT:      Head: Normocephalic and atraumatic.      Right Ear: External ear normal.      Left Ear: External ear normal.      Mouth/Throat:      Mouth: Mucous membranes are moist.   Eyes:      Extraocular Movements: Extraocular movements intact.      Conjunctiva/sclera: Conjunctivae normal.      Pupils: Pupils are equal, round, and reactive to light.   Cardiovascular:      Rate and Rhythm: Normal rate and regular rhythm.      Pulses: Normal pulses.      Heart sounds: Normal heart sounds. No murmur heard.  Pulmonary:      Effort: Pulmonary effort is normal. No respiratory distress.      Breath sounds: Normal breath sounds.   Abdominal:      General: Bowel sounds are normal.      Palpations: Abdomen is soft.  Tenderness: There is no abdominal tenderness.   Musculoskeletal:         General: Swelling present. Normal range of motion.      Cervical back: Normal range of motion and neck supple.      Comments: Mild swelling with many varicose veins.  No warmth or erythema.  No tenderness.  No palpable cords.  No calf tenderness.   Skin:     General: Skin is warm and dry.      Capillary Refill: Capillary refill takes less than 2 seconds.   Neurological:      General: No focal deficit present.      Mental Status: She is alert and oriented to person, place, and time.   Psychiatric:         Mood and Affect: Mood normal.         Behavior: Behavior normal.         Thought Content: Thought content normal.         Judgment: Judgment normal.       Patient Data   Labs Ordered/Reviewed - No data to display  No orders to display     Medical Decision Making        Medical Decision Making  Patient does have peripheral vascular disease with varicosities  of her lower legs.  Patient denied any redness or warmth to the skin.  She denied any tenderness to the leg or ankle.  Patient was given an Ace wrap of her ankle and an anti-inflammatory agent.  Patient was told to elevate her leg with warm moist heat applied.  Patient was told follow-up with PMD in the next 3 days.  Patient is 88 year old white female complaining of mild swelling to her left ankle after injuring it several weeks ago and having infection in the skin.  Patient was treated with clindamycin as well as doxycycline.  Patient now presents with mild swelling.  She went to her PMD and was given an appointment for an ultrasound of her leg at [facility] at 6:00 p.m.Marland Kitchen  Patient decided not to go for the ultrasound.  Patient came to [facility] instead.  Patient will be given an Ace wrap on the ankle and told to elevate the ankle with warm moist heat applied to the leg.  Patient will follow up with PMD in the next 3 days.                Clinical Impression   Peripheral vascular disease (CMS HCC) (Primary)   Superficial varicosities       Disposition: Discharged               Clinical Impression   Peripheral vascular disease (CMS HCC) (Primary)   Superficial varicosities       Current Discharge Medication List

## 2021-09-27 NOTE — ED Triage Notes (Addendum)
Sent by PCP, after 5 rounds of antibiotics Lt ankle still red and some swelling. PCP [facility] up apt [facility] at [facility] today, pt states she could not get there by 6 pm, so PCP told her to come here.

## 2021-10-11 IMAGING — MR MRI FOOT LT WO CONTRAST
4 of 6 series · 20 of 40 positions shown · non-contrast
Comparison: None.

﻿EXAM:  [id number]   MRI FOOT LT WO CONTRAST
INDICATION: Patient fell.  Left foot and ankle pain.
TECHNIQUE: Noncontrast multiplanar, multisequence MRI was performed.

[Series 5: T1 · sagittal · left · 3.5mm · 0.49mm/px · 6 of 22 slices shown (1 of 3)]
[im 1/22]
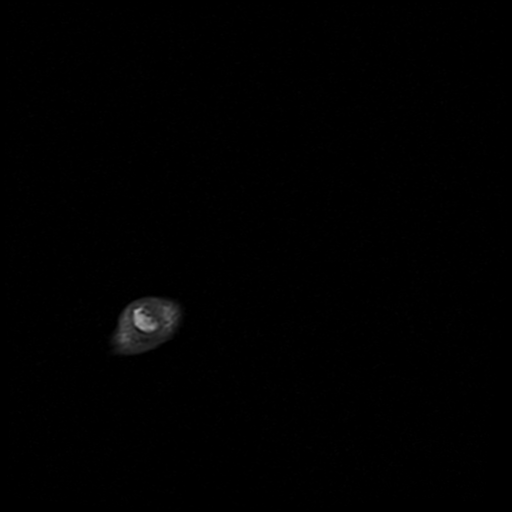
[im 5/22]
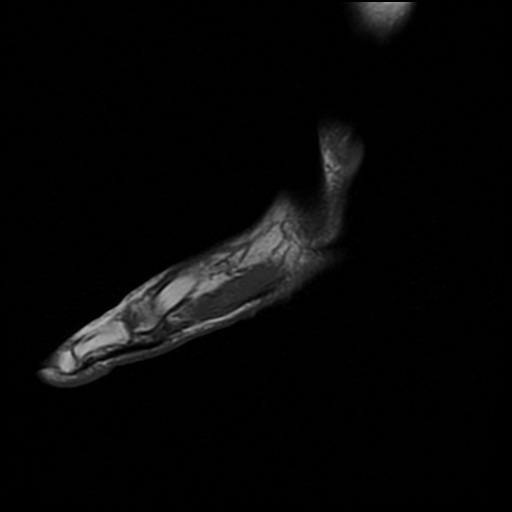
[im 9/22]
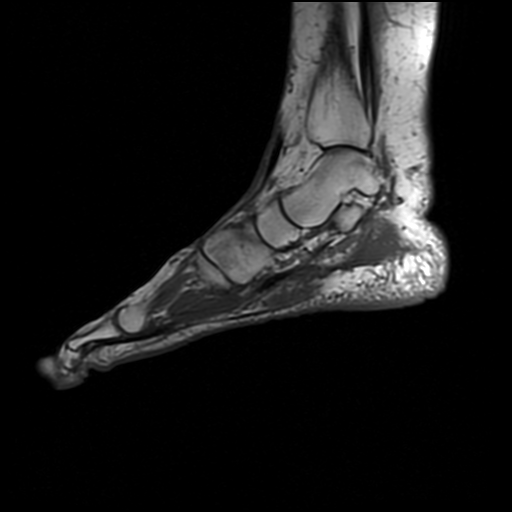
[im 13/22]
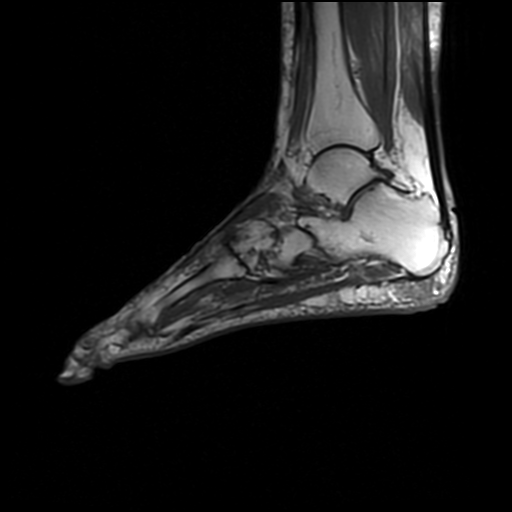
[im 17/22]
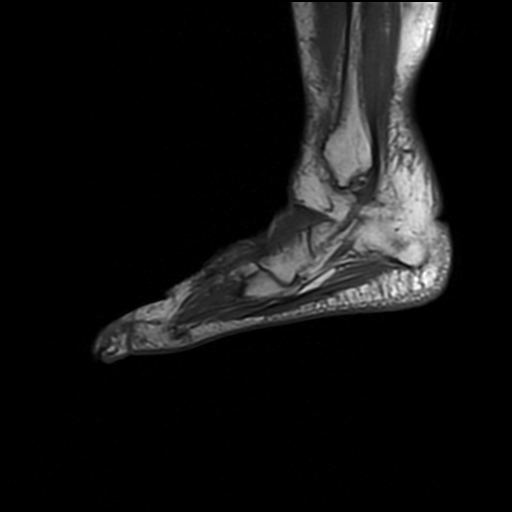
[im 22/22]
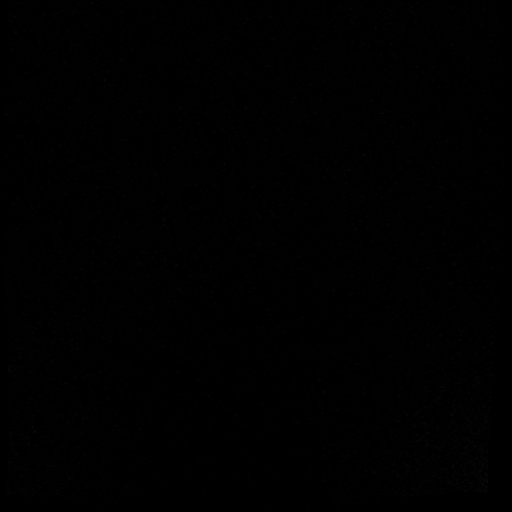

[Series 7: T1 · coronal · left · 6.0mm · 0.29mm/px · 5 of 28 slices shown (2 of 3)]
[im 1/28]
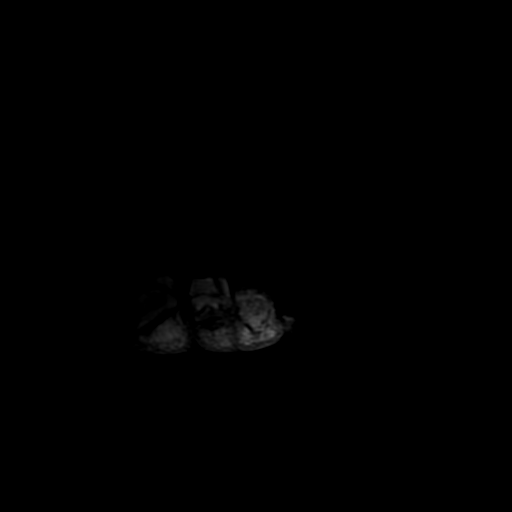
[im 4/28]
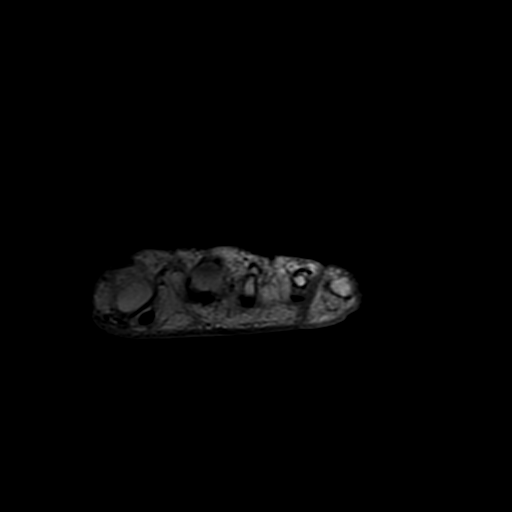
[im 8/28]
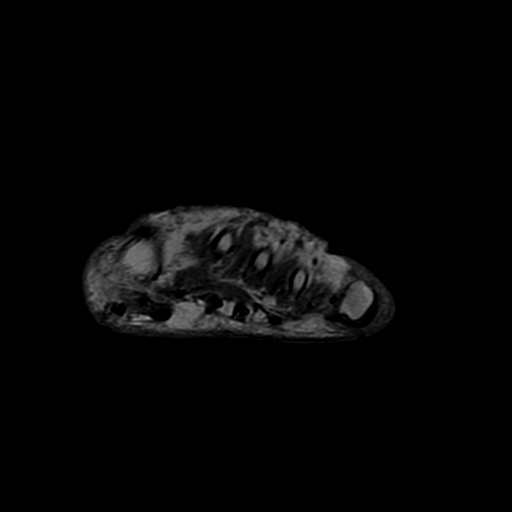
[im 16/28]
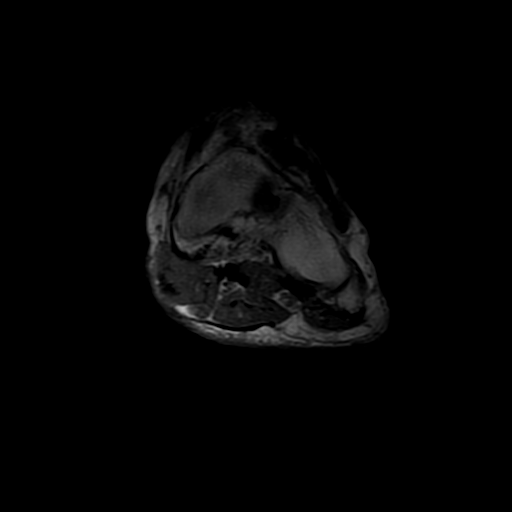
[im 24/28]
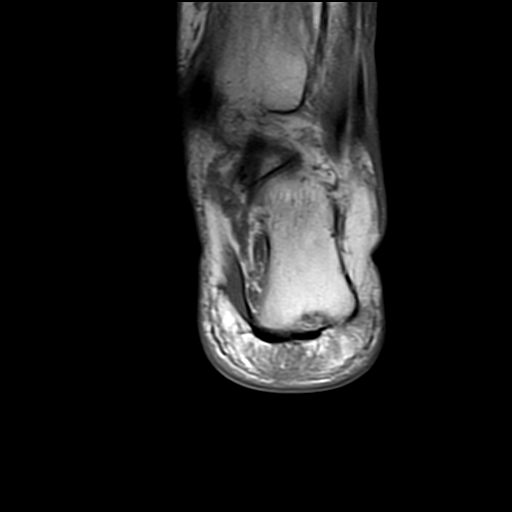

[Series 8: T1 · axial · left · 4.0mm · 0.49mm/px · z∈[-110,-29]mm · 3 of 22 slices shown (3 of 3)]
[im 5/22]
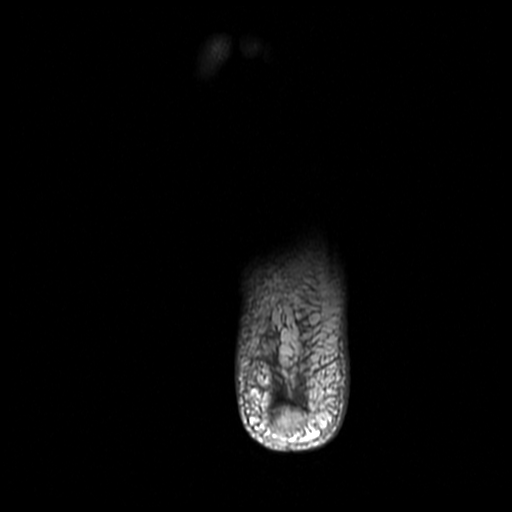
[im 13/22]
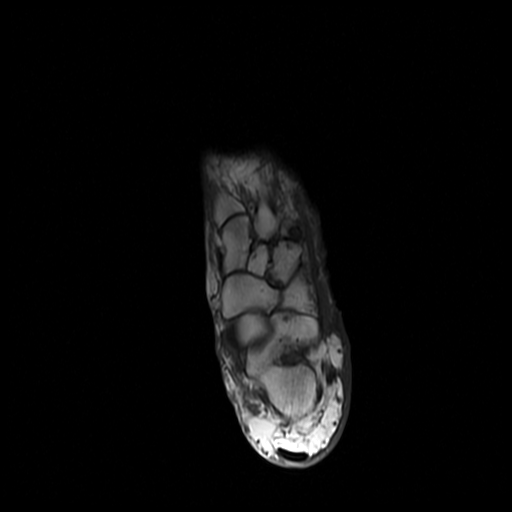
[im 22/22]
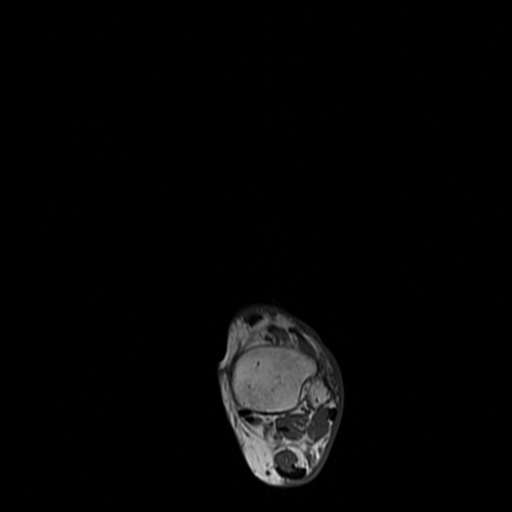

[Series 9: T2 fat-sat · axial · left · 4.0mm · 0.49mm/px · z∈[-129,-29]mm · 6 of 22 slices shown]
[im 1/22]
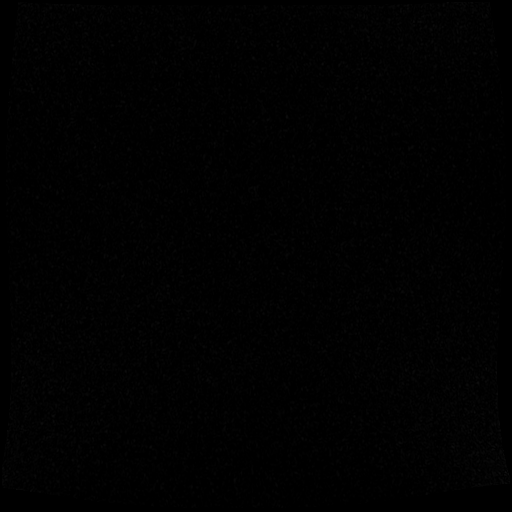
[im 5/22]
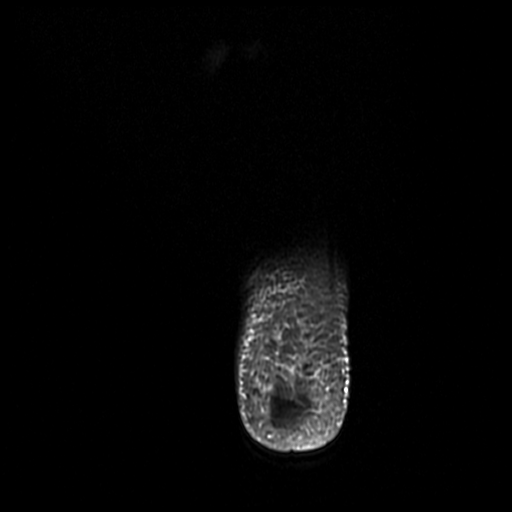
[im 9/22]
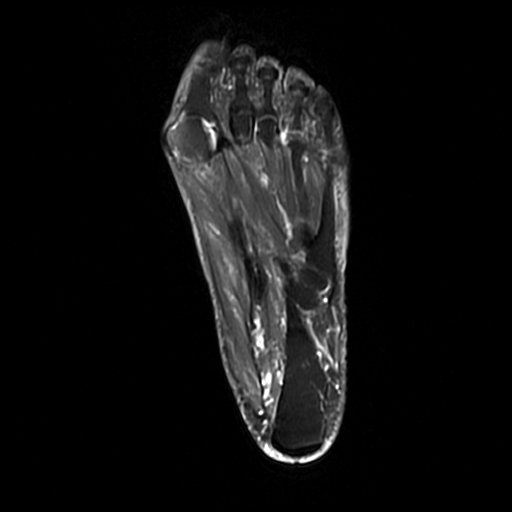
[im 13/22]
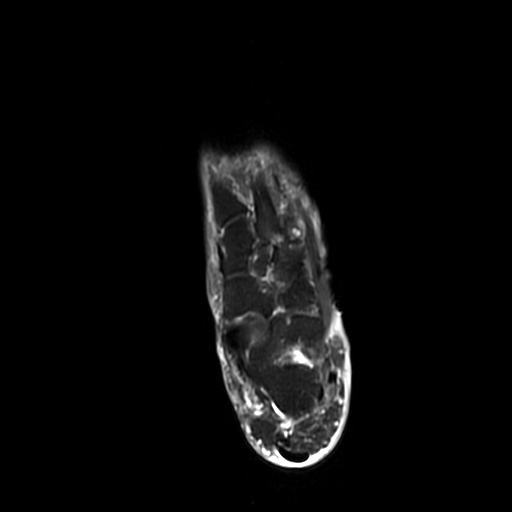
[im 17/22]
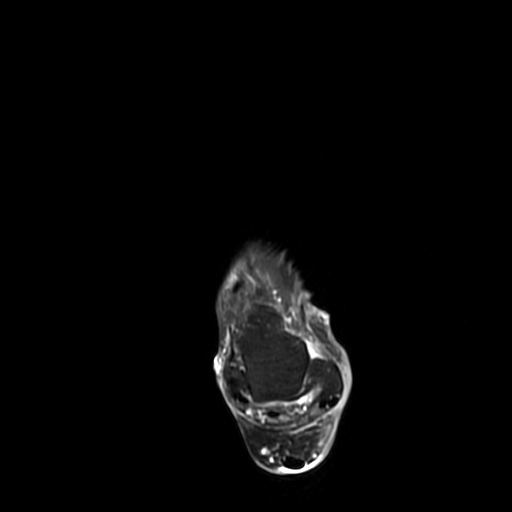
[im 22/22]
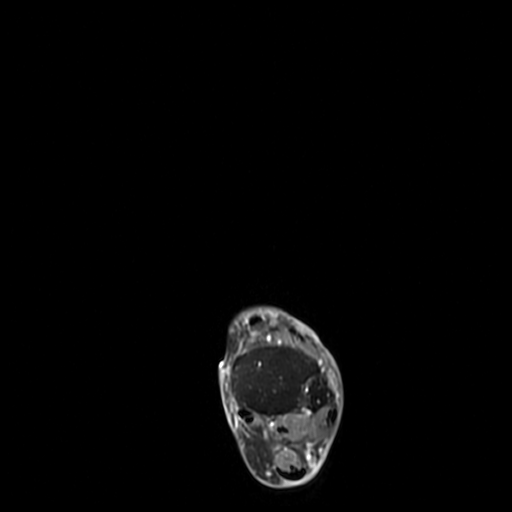

[20 of 40 positions shown; findings below may reference images not displayed]

FINDINGS: There are moderate degenerative changes predominantly in the midfoot.  There is minimal joint fluid. 

No fracture, stress fracture, or dislocation is seen.  There is no significant marrow signal alteration.  The visualized tendons and ligaments appear intact.
IMPRESSION: No fracture is seen.

## 2021-10-11 IMAGING — MR MRI ANKLE LT WO CONTAST
4 of 6 series · 23 of 40 positions shown · non-contrast
Comparison: None.

﻿EXAM:  [id number]   MRI ANKLE LT WO CONTAST
INDICATION: Patient fell.  Left ankle and foot pain.
TECHNIQUE: Noncontrast multiplanar, multisequence MRI was performed.

[Series 8: T1 · sagittal · left · 3.0mm · 0.33mm/px · 5 of 20 slices shown (1 of 3)]
[im 1/20]
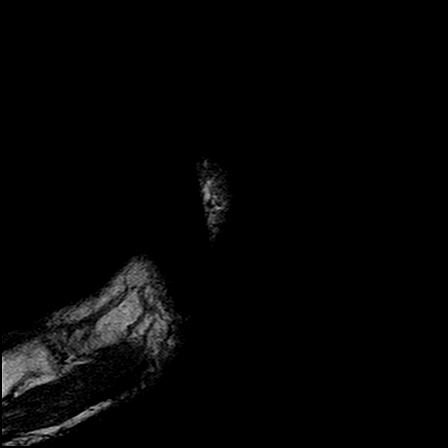
[im 5/20]
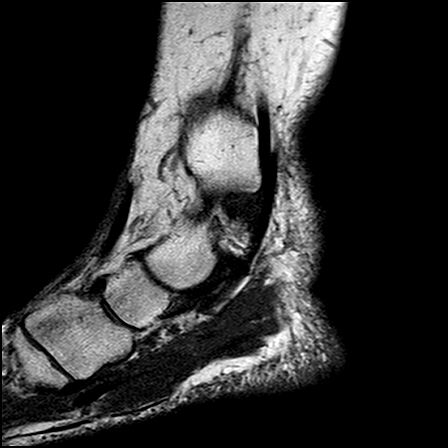
[im 10/20]
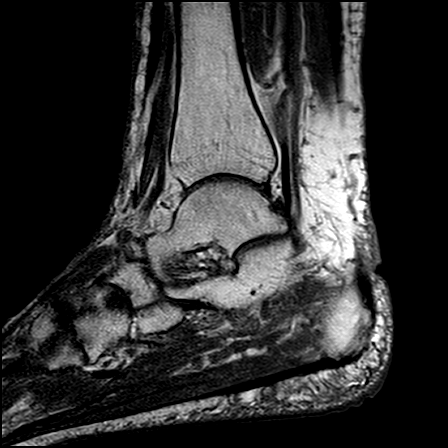
[im 15/20]
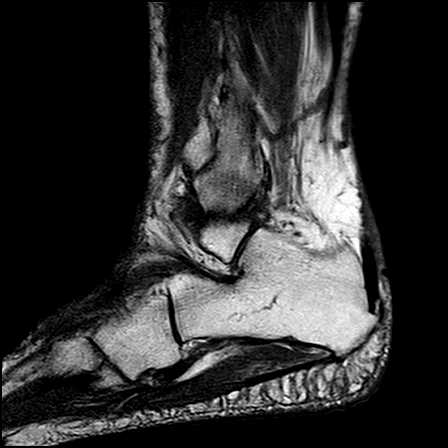
[im 20/20]
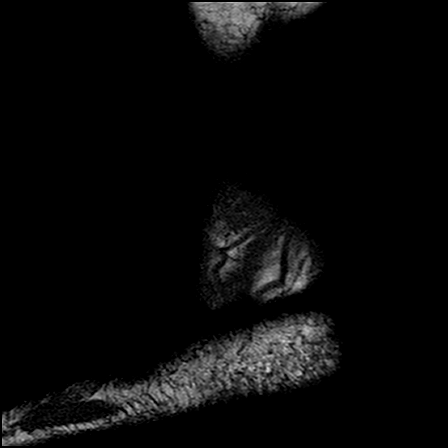

[Series 10: T1 · axial · left · 4.0mm · 0.29mm/px · z∈[-52,+52]mm · 7 of 28 slices shown (2 of 3)]
[im 1/28]
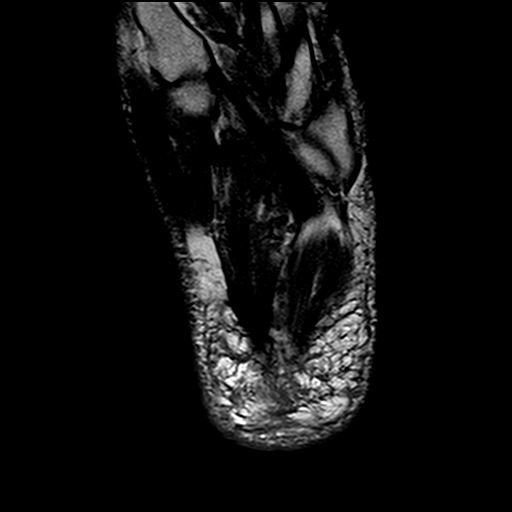
[im 4/28]
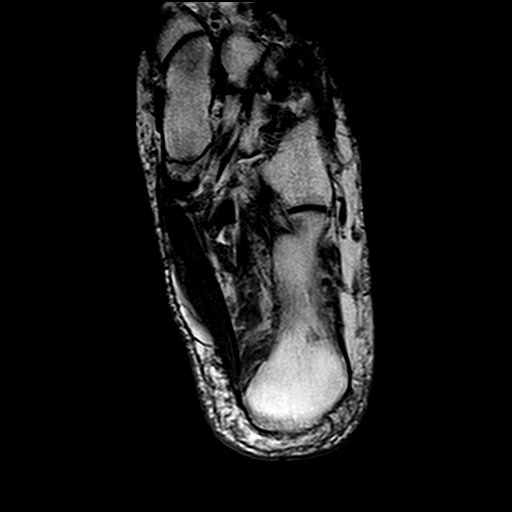
[im 8/28]
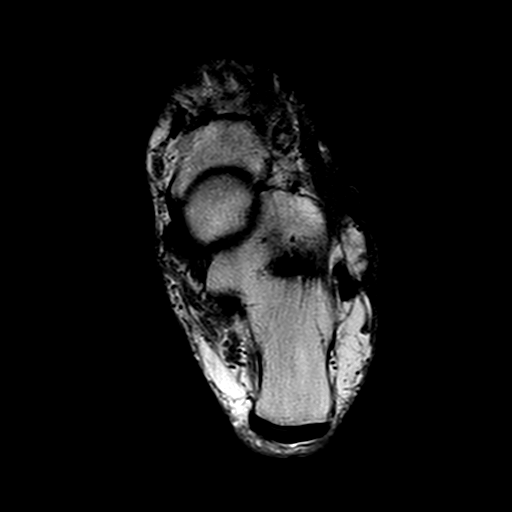
[im 12/28]
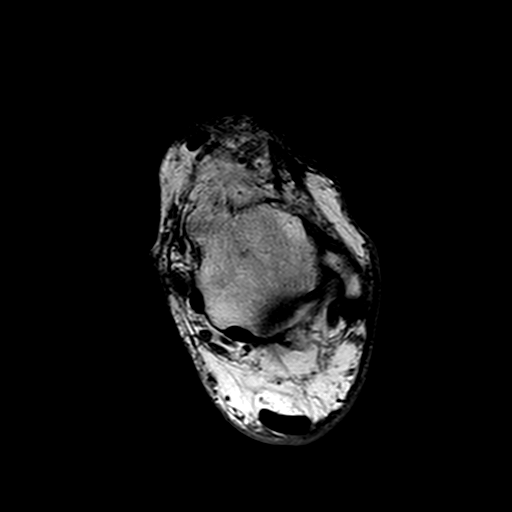
[im 16/28]
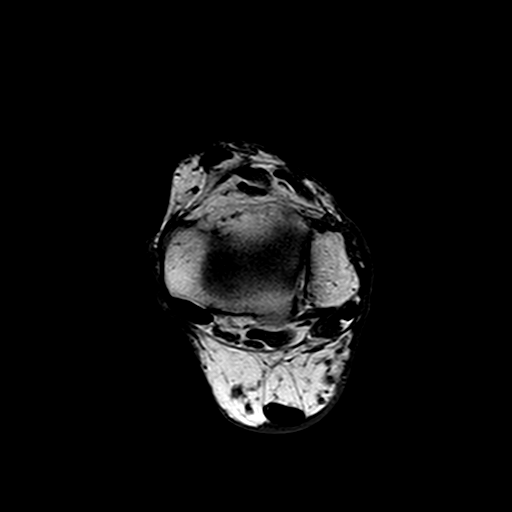
[im 20/28]
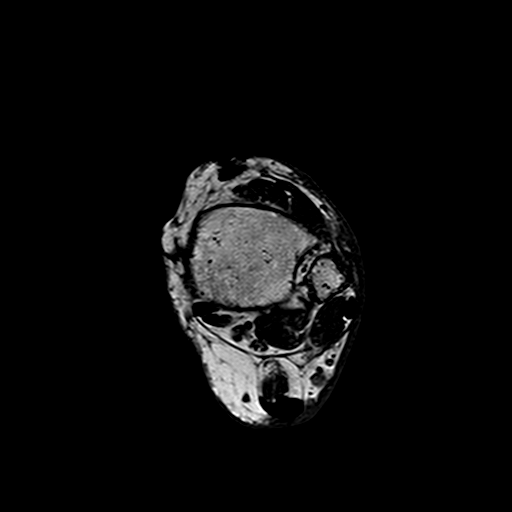
[im 24/28]
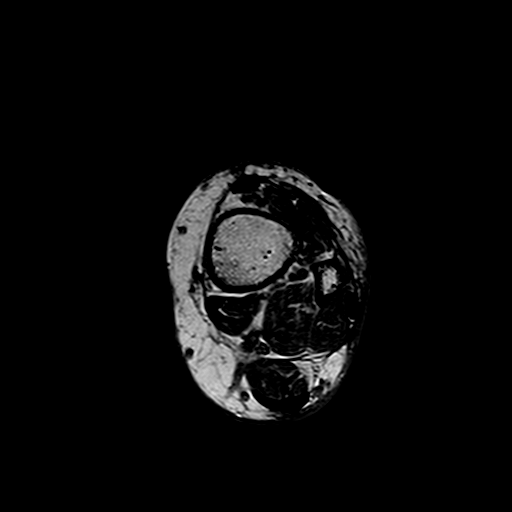

[Series 11: T2 fat-sat · axial · left · 4.0mm · 0.33mm/px · z∈[-52,+70]mm · 8 of 28 slices shown]
[im 1/28]
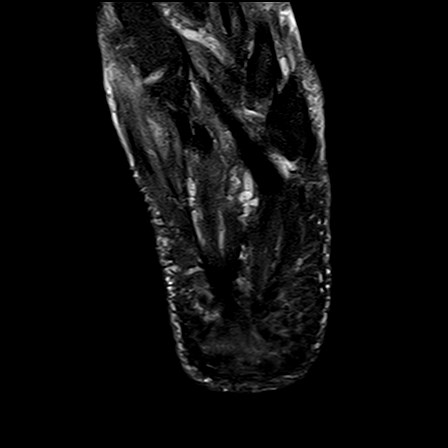
[im 4/28]
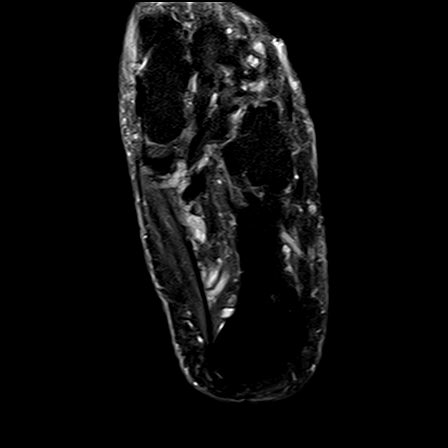
[im 8/28]
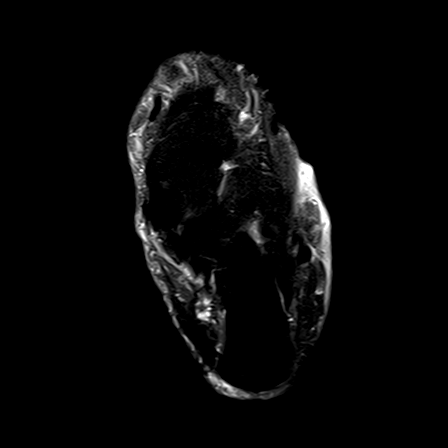
[im 12/28]
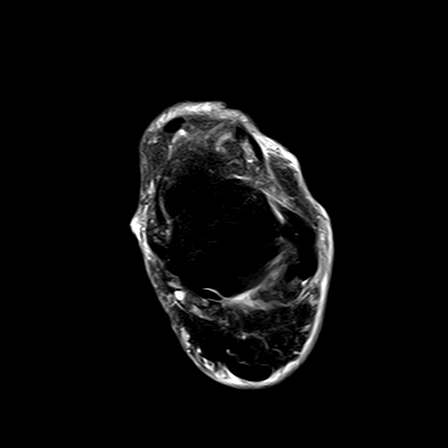
[im 16/28]
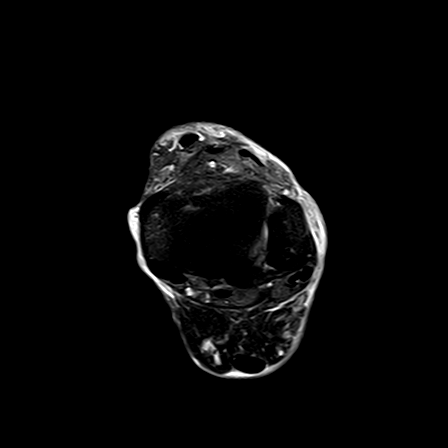
[im 20/28]
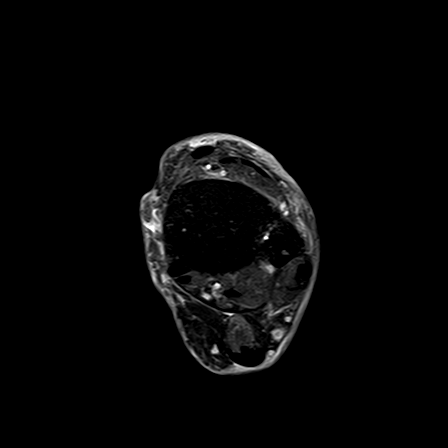
[im 24/28]
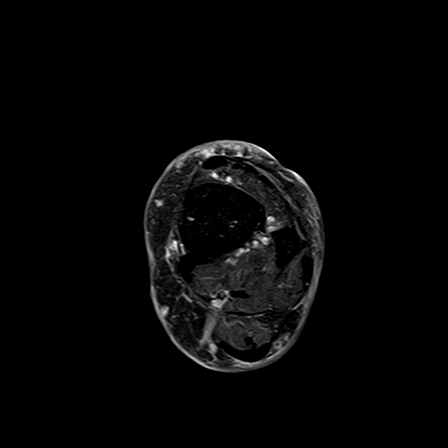
[im 28/28]
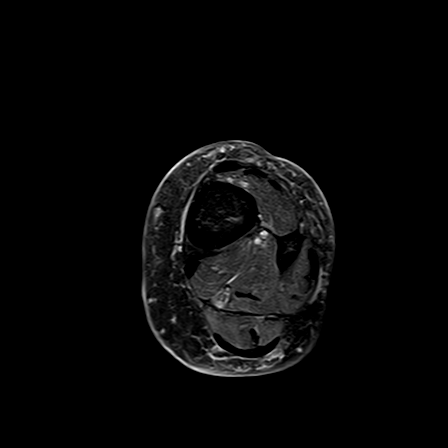

[Series 12: T1 · coronal · left · 4.0mm · 0.33mm/px · 3 of 26 slices shown (3 of 3)]
[im 5/26]
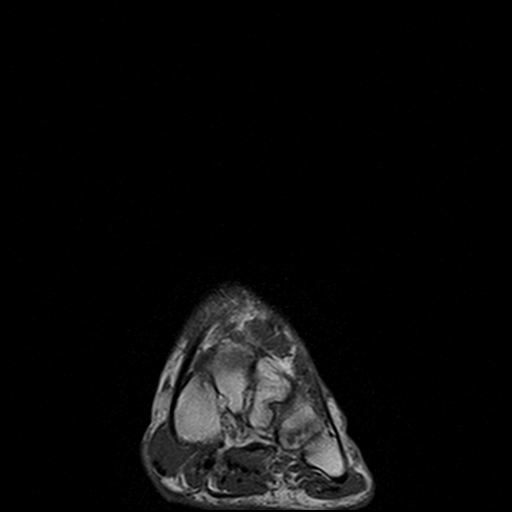
[im 13/26]
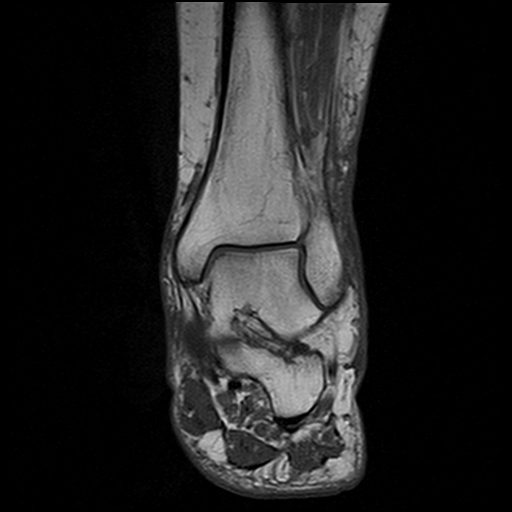
[im 21/26]
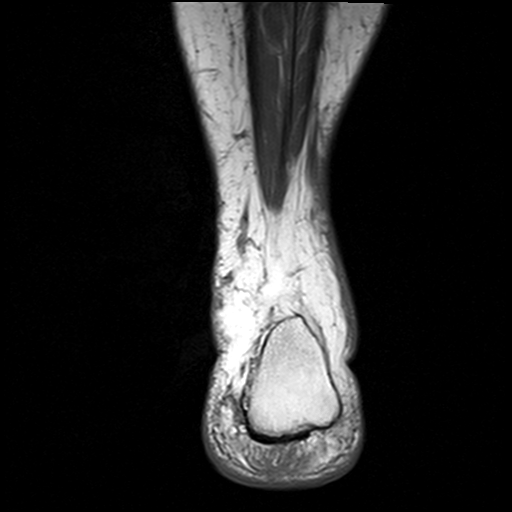

[23 of 40 positions shown; findings below may reference images not displayed]

FINDINGS: There is mild, diffuse, superficial soft tissue edema.  No joint effusion or significant fluid collection is seen.  

There are mild to moderate degenerative changes.  Dorsal calcaneal spurring is noted.

No fracture, stress fracture, or dislocation is seen.  There is no significant marrow signal alteration.  

The visualized ligaments and tendons appear intact.  The plantar fascia appears unremarkable.  There is minimal edema in the sinus tarsi.
IMPRESSION: No fracture is seen.
